# Patient Record
Sex: Female | Born: 1945 | Race: White | Hispanic: No | Smoking: Never smoker
Health system: Southern US, Community
[De-identification: ages and names within clinical notes are randomized; demographics above are authoritative.]

## PROBLEM LIST (undated history)

## (undated) DIAGNOSIS — IMO0001 Reserved for inherently not codable concepts without codable children: Secondary | ICD-10-CM

## (undated) DIAGNOSIS — E119 Type 2 diabetes mellitus without complications: Secondary | ICD-10-CM

## (undated) DIAGNOSIS — Z9989 Dependence on other enabling machines and devices: Secondary | ICD-10-CM

## (undated) DIAGNOSIS — I10 Essential (primary) hypertension: Secondary | ICD-10-CM

## (undated) DIAGNOSIS — K219 Gastro-esophageal reflux disease without esophagitis: Secondary | ICD-10-CM

## (undated) DIAGNOSIS — F329 Major depressive disorder, single episode, unspecified: Secondary | ICD-10-CM

## (undated) DIAGNOSIS — N189 Chronic kidney disease, unspecified: Secondary | ICD-10-CM

## (undated) DIAGNOSIS — R519 Headache, unspecified: Secondary | ICD-10-CM

## (undated) DIAGNOSIS — Z95810 Presence of automatic (implantable) cardiac defibrillator: Secondary | ICD-10-CM

## (undated) DIAGNOSIS — I209 Angina pectoris, unspecified: Secondary | ICD-10-CM

## (undated) DIAGNOSIS — G4733 Obstructive sleep apnea (adult) (pediatric): Secondary | ICD-10-CM

## (undated) DIAGNOSIS — D860 Sarcoidosis of lung: Secondary | ICD-10-CM

## (undated) DIAGNOSIS — I219 Acute myocardial infarction, unspecified: Secondary | ICD-10-CM

## (undated) DIAGNOSIS — F419 Anxiety disorder, unspecified: Secondary | ICD-10-CM

## (undated) DIAGNOSIS — I442 Atrioventricular block, complete: Secondary | ICD-10-CM

## (undated) DIAGNOSIS — I499 Cardiac arrhythmia, unspecified: Secondary | ICD-10-CM

## (undated) DIAGNOSIS — R51 Headache: Secondary | ICD-10-CM

## (undated) DIAGNOSIS — Z789 Other specified health status: Secondary | ICD-10-CM

## (undated) DIAGNOSIS — F32A Depression, unspecified: Secondary | ICD-10-CM

## (undated) HISTORY — DX: Other specified health status: Z78.9

## (undated) HISTORY — DX: Reserved for inherently not codable concepts without codable children: IMO0001

## (undated) HISTORY — PX: CHOLECYSTECTOMY: SHX55

---

## 2004-08-20 ENCOUNTER — Ambulatory Visit: Payer: Self-pay | Admitting: Occupational Therapy

## 2009-07-11 ENCOUNTER — Observation Stay: Payer: Self-pay | Admitting: Internal Medicine

## 2010-10-12 ENCOUNTER — Ambulatory Visit: Payer: Self-pay | Admitting: Family Medicine

## 2010-10-31 IMAGING — CT CT HEAD WITHOUT CONTRAST
2 series · 16 of 30 positions shown, 20 images · non-contrast
Comparison: none

REASON FOR EXAM: AMS earlier, now resolved
COMMENTS:

[Series 2: without · axial · non-contrast · 0.41mm/px · z∈[-93,+27]mm · 13 of 28 slices shown, 17 images]
[im 2/28  brain]
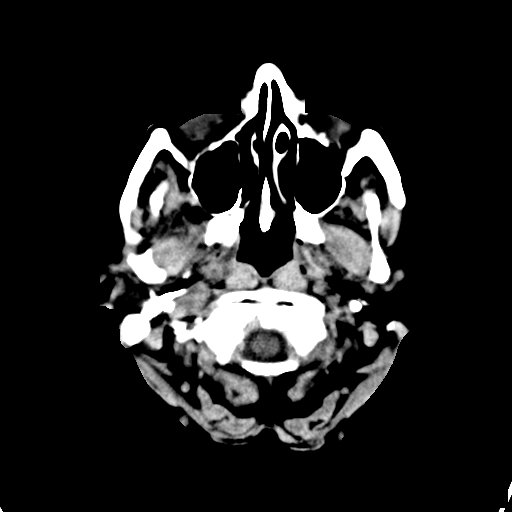
[im 2/28  bone]
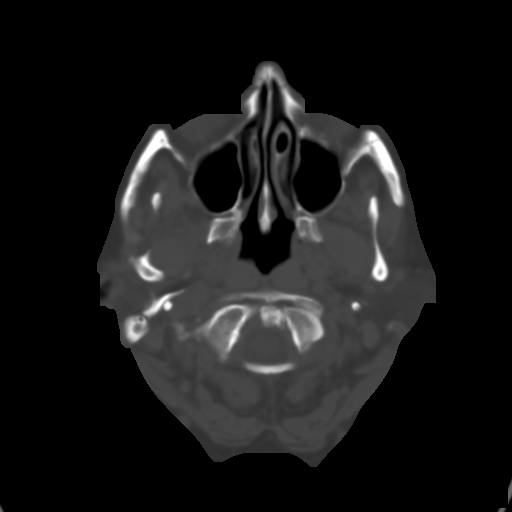
[im 4/28  brain]
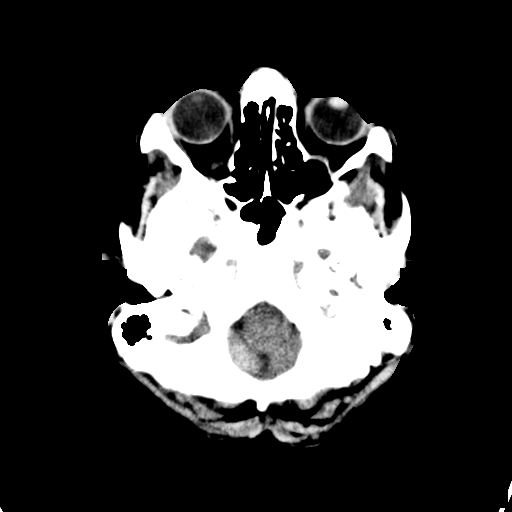
[im 6/28  brain]
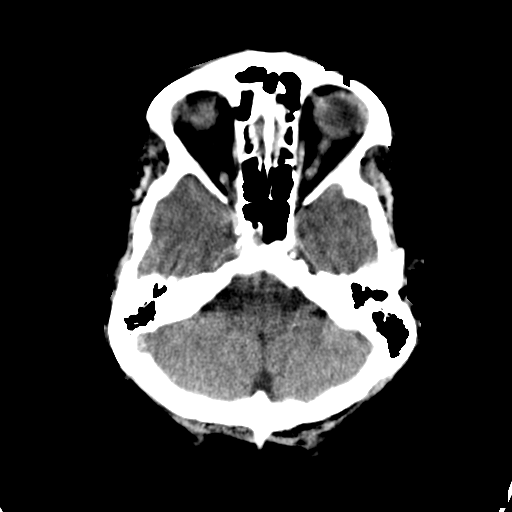
[im 8/28  brain]
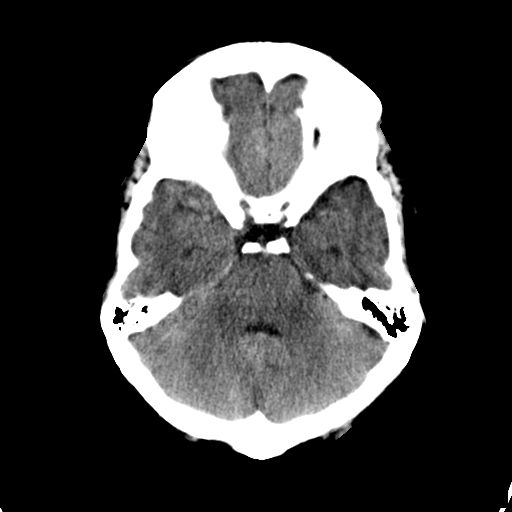
[im 10/28  brain]
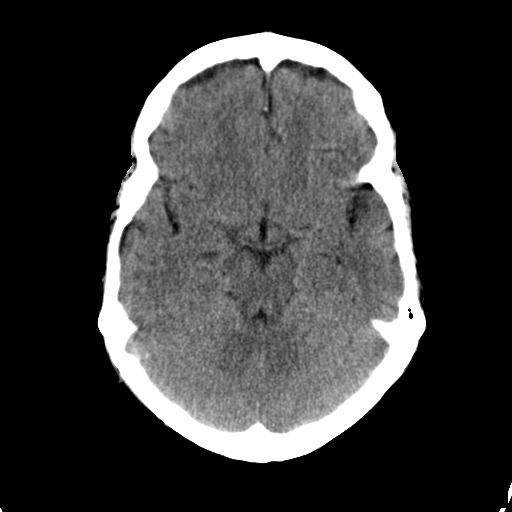
[im 10/28  bone]
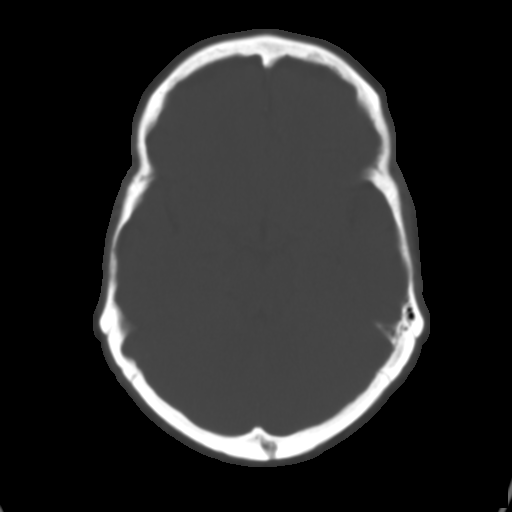
[im 12/28  brain]
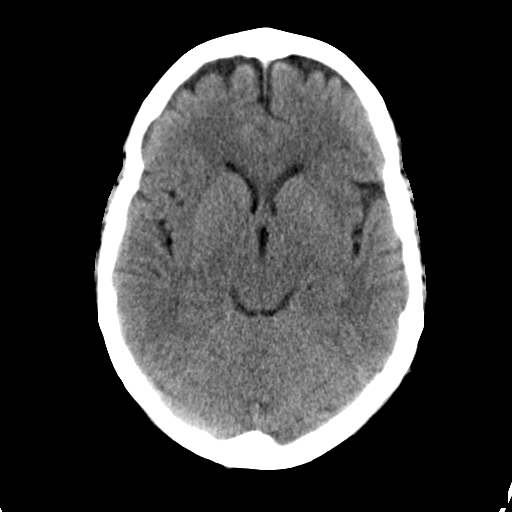
[im 14/28  brain]
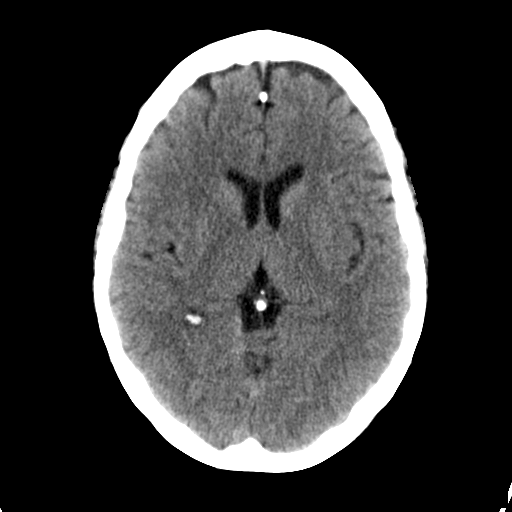
[im 16/28  brain]
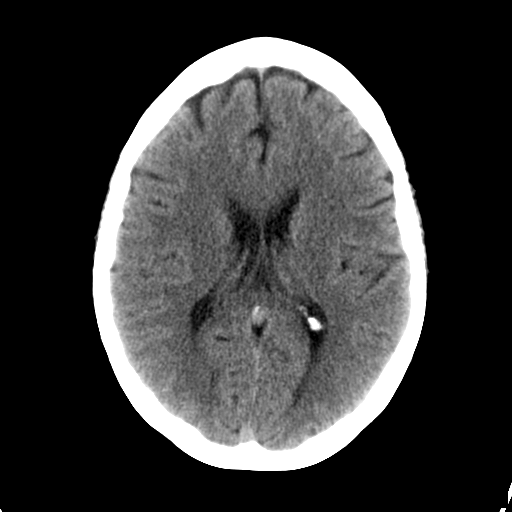
[im 18/28  brain]
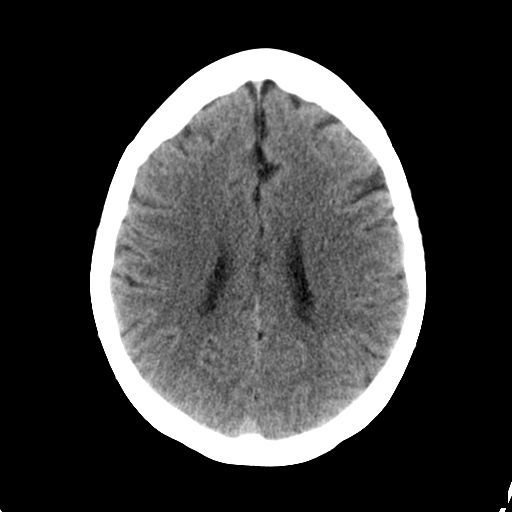
[im 18/28  bone]
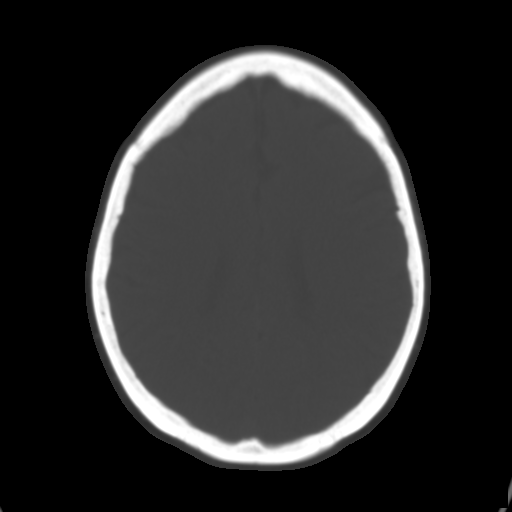
[im 20/28  brain]
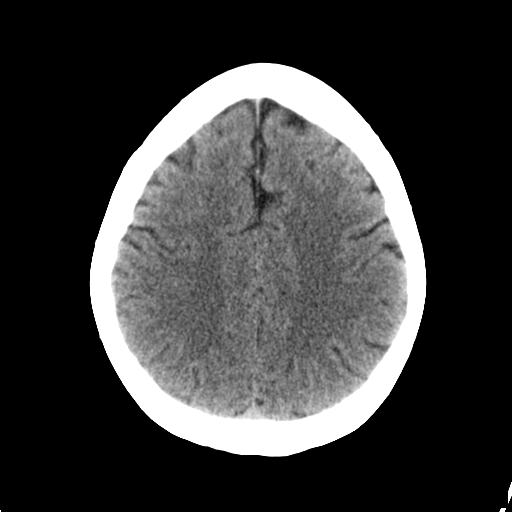
[im 22/28  brain]
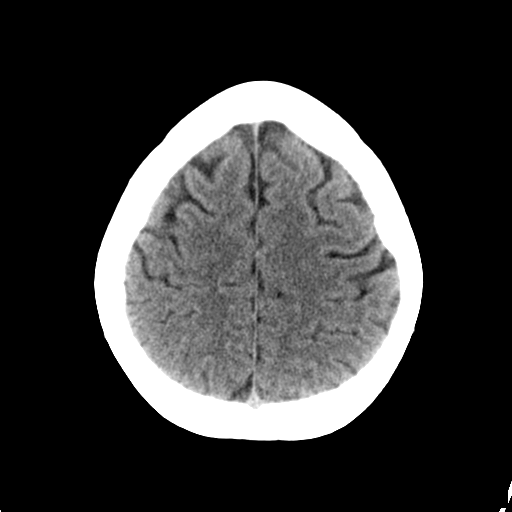
[im 24/28  brain]
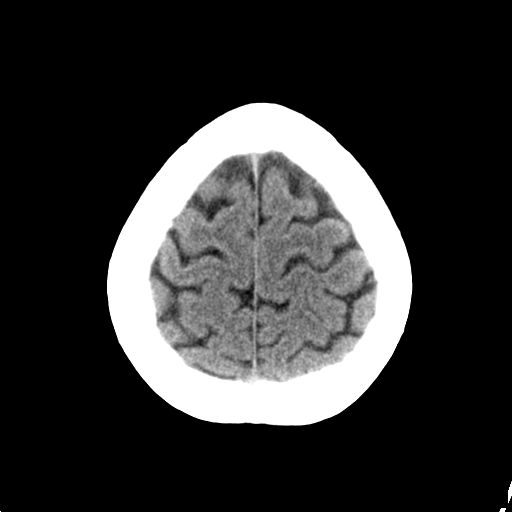
[im 26/28  brain]
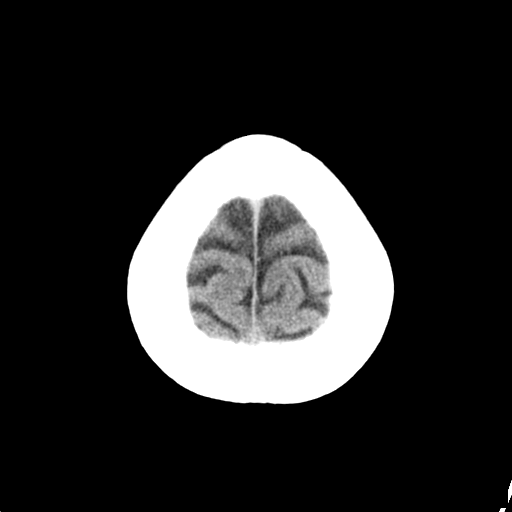
[im 26/28  bone]
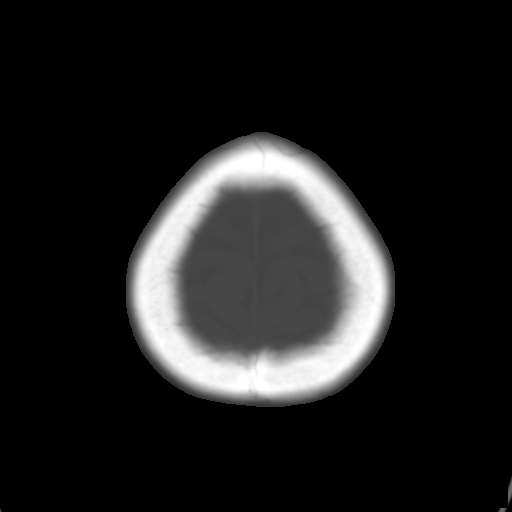

[Series 3: bone · axial · 0.41mm/px · z∈[-93,-53]mm · 3 of 28 slices shown]
[im 2/28  bone]
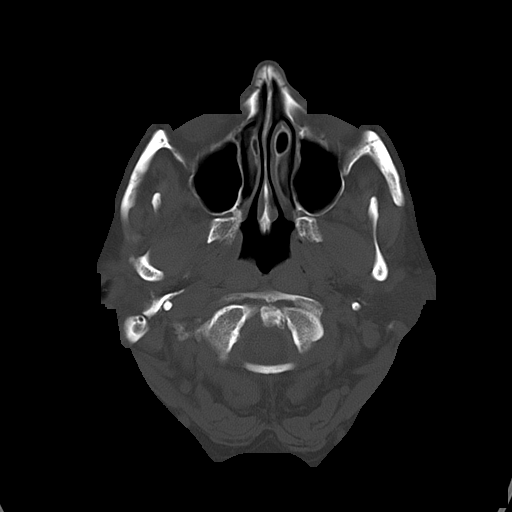
[im 6/28  bone]
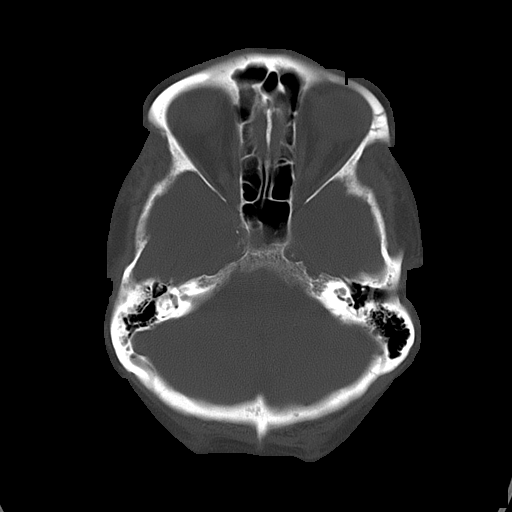
[im 10/28  bone]
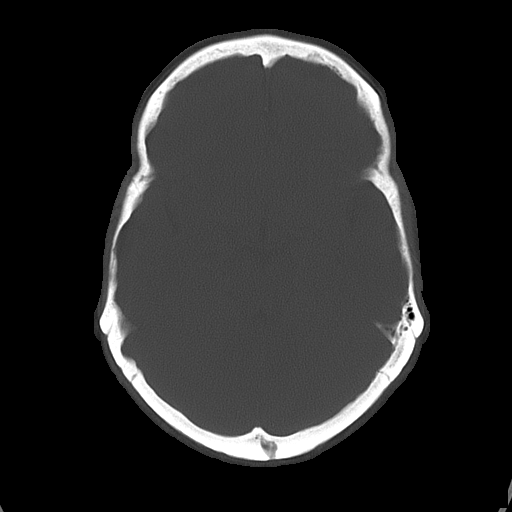

[16 of 30 positions shown; findings below may reference images not displayed]

PROCEDURE:     CT  - CT HEAD WITHOUT CONTRAST  - July 11, 2009  [DATE]

RESULT:     Noncontrast emergent CT of the brain is performed in the
standard fashion. The patient has no previous examination for comparison.

The ventricles and sulci are normal. There is no hemorrhage. There is no
focal mass, mass-effect or midline shift. There is no evidence of edema or
territorial infarct. The bone windows demonstrate normal aeration of the
paranasal sinuses and mastoid air cells. There is no skull fracture
demonstrated.
IMPRESSION: 1. No acute intracranial abnormality.

## 2010-10-31 IMAGING — CR DG CHEST 1V PORT
1 series · 1 of 1 positions shown · non-contrast
Comparison: none

REASON FOR EXAM: AMS
COMMENTS:

PROCEDURE:     DXR - DXR PORTABLE CHEST SINGLE VIEW  - July 11, 2009  [DATE]
RESULT:     There is no previous exam for comparison. The lungs appear
clear. The heart and pulmonary vessels are normal. The bony structures are
unremarkable. Cardiac monitoring electrodes are present.

[view not recorded]
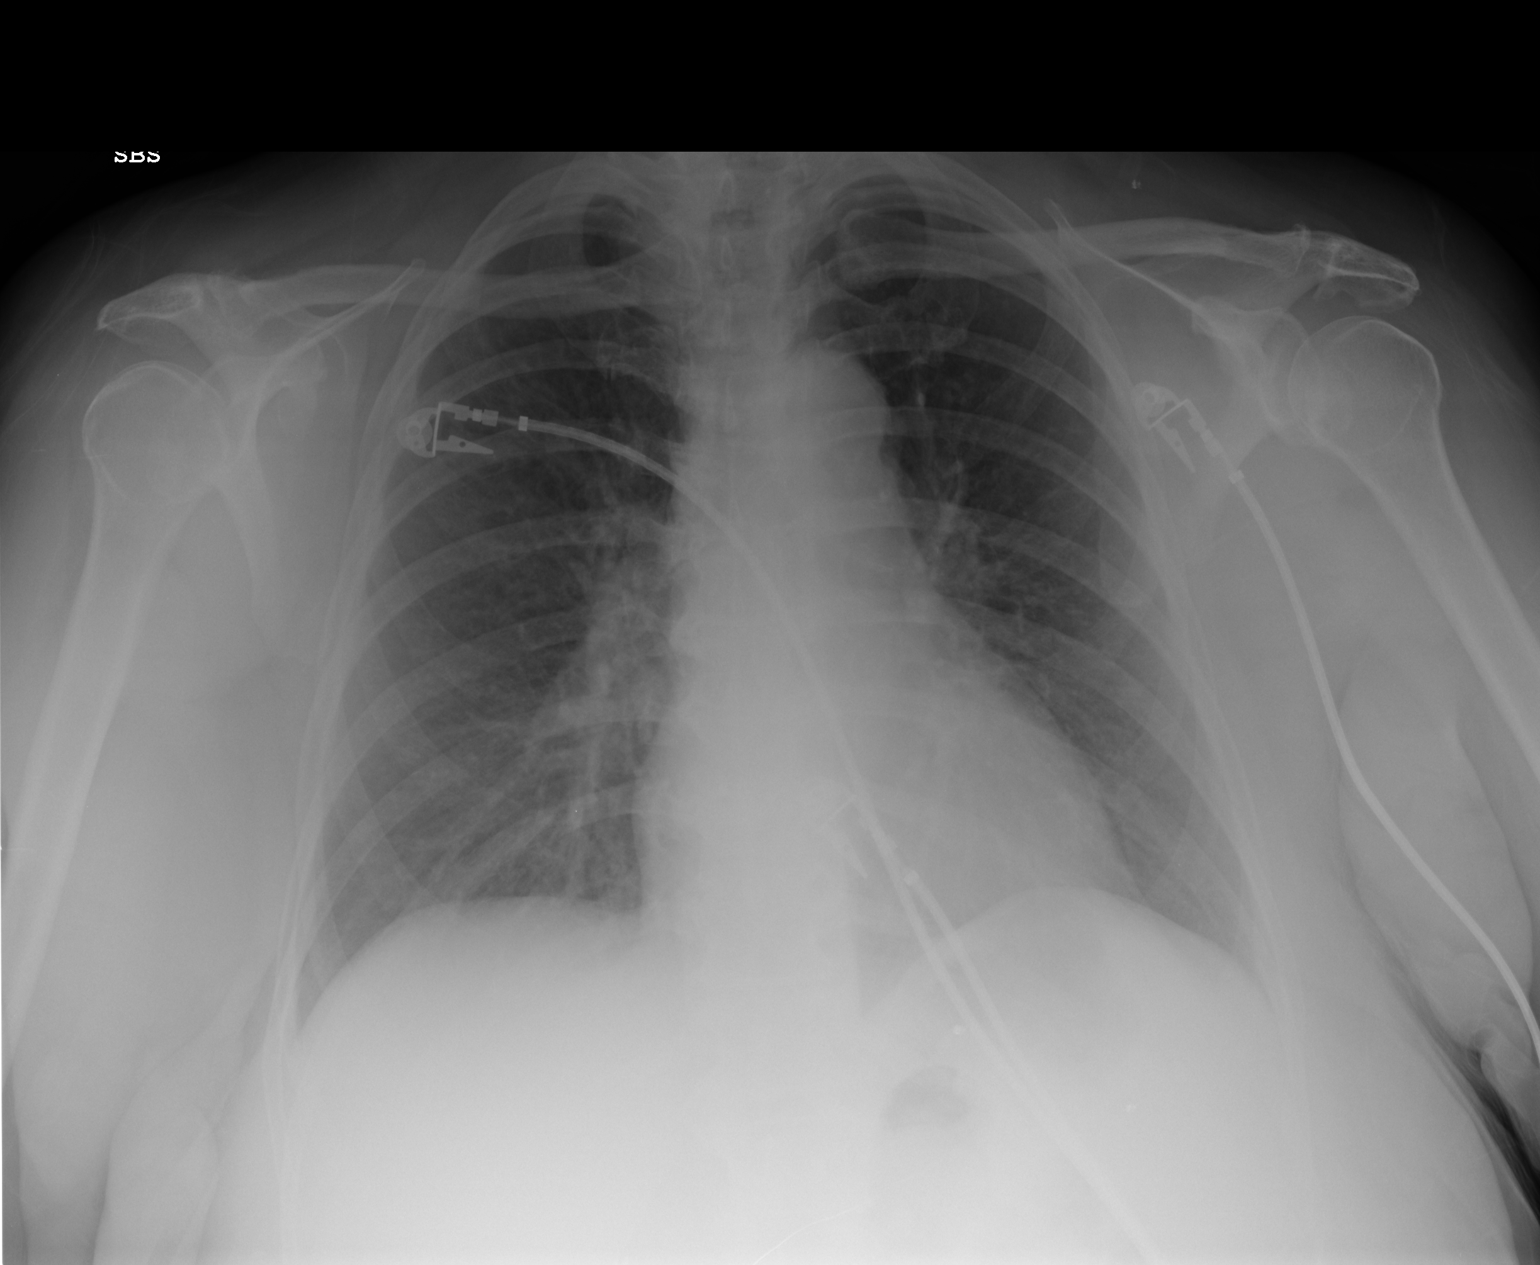

[1 of 1 positions shown; findings below may reference images not displayed]

IMPRESSION: No acute cardiopulmonary disease.

## 2014-07-24 ENCOUNTER — Inpatient Hospital Stay (HOSPITAL_COMMUNITY): Payer: Medicare Other

## 2014-07-24 ENCOUNTER — Inpatient Hospital Stay (HOSPITAL_COMMUNITY): Payer: Medicare Other | Admitting: Anesthesiology

## 2014-07-24 ENCOUNTER — Inpatient Hospital Stay (HOSPITAL_COMMUNITY)
Admission: AD | Admit: 2014-07-24 | Discharge: 2014-07-29 | DRG: 308 | Disposition: A | Discharge status: Home / Self Care | Payer: Medicare Other | Source: Other Acute Inpatient Hospital | Attending: Internal Medicine | Admitting: Internal Medicine

## 2014-07-24 DIAGNOSIS — R001 Bradycardia, unspecified: Secondary | ICD-10-CM | POA: Diagnosis present

## 2014-07-24 DIAGNOSIS — K219 Gastro-esophageal reflux disease without esophagitis: Secondary | ICD-10-CM | POA: Diagnosis present

## 2014-07-24 DIAGNOSIS — E119 Type 2 diabetes mellitus without complications: Secondary | ICD-10-CM | POA: Diagnosis present

## 2014-07-24 DIAGNOSIS — E875 Hyperkalemia: Secondary | ICD-10-CM | POA: Diagnosis present

## 2014-07-24 DIAGNOSIS — D860 Sarcoidosis of lung: Secondary | ICD-10-CM | POA: Diagnosis present

## 2014-07-24 DIAGNOSIS — I82612 Acute embolism and thrombosis of superficial veins of left upper extremity: Secondary | ICD-10-CM | POA: Diagnosis not present

## 2014-07-24 DIAGNOSIS — E872 Acidosis: Secondary | ICD-10-CM | POA: Diagnosis present

## 2014-07-24 DIAGNOSIS — I442 Atrioventricular block, complete: Principal | ICD-10-CM | POA: Diagnosis present

## 2014-07-24 DIAGNOSIS — I38 Endocarditis, valve unspecified: Secondary | ICD-10-CM | POA: Diagnosis present

## 2014-07-24 DIAGNOSIS — Z7982 Long term (current) use of aspirin: Secondary | ICD-10-CM

## 2014-07-24 DIAGNOSIS — J9690 Respiratory failure, unspecified, unspecified whether with hypoxia or hypercapnia: Secondary | ICD-10-CM | POA: Insufficient documentation

## 2014-07-24 DIAGNOSIS — E876 Hypokalemia: Secondary | ICD-10-CM | POA: Diagnosis present

## 2014-07-24 DIAGNOSIS — I2510 Atherosclerotic heart disease of native coronary artery without angina pectoris: Secondary | ICD-10-CM | POA: Diagnosis present

## 2014-07-24 DIAGNOSIS — I129 Hypertensive chronic kidney disease with stage 1 through stage 4 chronic kidney disease, or unspecified chronic kidney disease: Secondary | ICD-10-CM | POA: Diagnosis present

## 2014-07-24 DIAGNOSIS — M1990 Unspecified osteoarthritis, unspecified site: Secondary | ICD-10-CM | POA: Diagnosis present

## 2014-07-24 DIAGNOSIS — Z6841 Body Mass Index (BMI) 40.0 and over, adult: Secondary | ICD-10-CM

## 2014-07-24 DIAGNOSIS — G9341 Metabolic encephalopathy: Secondary | ICD-10-CM | POA: Diagnosis present

## 2014-07-24 DIAGNOSIS — J9601 Acute respiratory failure with hypoxia: Secondary | ICD-10-CM | POA: Diagnosis present

## 2014-07-24 DIAGNOSIS — F329 Major depressive disorder, single episode, unspecified: Secondary | ICD-10-CM | POA: Diagnosis present

## 2014-07-24 DIAGNOSIS — D649 Anemia, unspecified: Secondary | ICD-10-CM | POA: Diagnosis present

## 2014-07-24 DIAGNOSIS — I469 Cardiac arrest, cause unspecified: Secondary | ICD-10-CM | POA: Diagnosis present

## 2014-07-24 DIAGNOSIS — Z452 Encounter for adjustment and management of vascular access device: Secondary | ICD-10-CM

## 2014-07-24 DIAGNOSIS — N189 Chronic kidney disease, unspecified: Secondary | ICD-10-CM | POA: Diagnosis present

## 2014-07-24 DIAGNOSIS — R570 Cardiogenic shock: Secondary | ICD-10-CM | POA: Diagnosis present

## 2014-07-24 DIAGNOSIS — E2740 Unspecified adrenocortical insufficiency: Secondary | ICD-10-CM | POA: Diagnosis present

## 2014-07-24 DIAGNOSIS — Z01818 Encounter for other preprocedural examination: Secondary | ICD-10-CM

## 2014-07-24 DIAGNOSIS — I959 Hypotension, unspecified: Secondary | ICD-10-CM | POA: Diagnosis present

## 2014-07-24 DIAGNOSIS — D696 Thrombocytopenia, unspecified: Secondary | ICD-10-CM | POA: Diagnosis present

## 2014-07-24 DIAGNOSIS — J449 Chronic obstructive pulmonary disease, unspecified: Secondary | ICD-10-CM | POA: Diagnosis present

## 2014-07-24 DIAGNOSIS — J9600 Acute respiratory failure, unspecified whether with hypoxia or hypercapnia: Secondary | ICD-10-CM

## 2014-07-24 DIAGNOSIS — I462 Cardiac arrest due to underlying cardiac condition: Secondary | ICD-10-CM

## 2014-07-24 DIAGNOSIS — E785 Hyperlipidemia, unspecified: Secondary | ICD-10-CM | POA: Diagnosis present

## 2014-07-24 DIAGNOSIS — F419 Anxiety disorder, unspecified: Secondary | ICD-10-CM | POA: Diagnosis present

## 2014-07-24 DIAGNOSIS — Z7952 Long term (current) use of systemic steroids: Secondary | ICD-10-CM | POA: Diagnosis not present

## 2014-07-24 DIAGNOSIS — N179 Acute kidney failure, unspecified: Secondary | ICD-10-CM | POA: Diagnosis present

## 2014-07-24 DIAGNOSIS — R1110 Vomiting, unspecified: Secondary | ICD-10-CM

## 2014-07-24 HISTORY — DX: Anxiety disorder, unspecified: F41.9

## 2014-07-24 HISTORY — DX: Type 2 diabetes mellitus without complications: E11.9

## 2014-07-24 HISTORY — DX: Essential (primary) hypertension: I10

## 2014-07-24 HISTORY — DX: Reserved for inherently not codable concepts without codable children: IMO0001

## 2014-07-24 HISTORY — DX: Headache, unspecified: R51.9

## 2014-07-24 HISTORY — DX: Angina pectoris, unspecified: I20.9

## 2014-07-24 HISTORY — DX: Major depressive disorder, single episode, unspecified: F32.9

## 2014-07-24 HISTORY — DX: Depression, unspecified: F32.A

## 2014-07-24 HISTORY — DX: Headache: R51

## 2014-07-24 HISTORY — DX: Acute myocardial infarction, unspecified: I21.9

## 2014-07-24 HISTORY — DX: Chronic kidney disease, unspecified: N18.9

## 2014-07-24 HISTORY — DX: Gastro-esophageal reflux disease without esophagitis: K21.9

## 2014-07-24 HISTORY — DX: Cardiac arrhythmia, unspecified: I49.9

## 2014-07-24 LAB — POCT I-STAT, CHEM 8
BUN: 109 mg/dL — ABNORMAL HIGH (ref 6–23)
CALCIUM ION: 1.27 mmol/L (ref 1.13–1.30)
Chloride: 109 mEq/L (ref 96–112)
Creatinine, Ser: 5.5 mg/dL — ABNORMAL HIGH (ref 0.50–1.10)
GLUCOSE: 267 mg/dL — AB (ref 70–99)
HEMATOCRIT: 32 % — AB (ref 36.0–46.0)
HEMOGLOBIN: 10.9 g/dL — AB (ref 12.0–15.0)
Potassium: 6.7 mmol/L (ref 3.5–5.1)
Sodium: 134 mmol/L — ABNORMAL LOW (ref 135–145)
TCO2: 14 mmol/L (ref 0–100)

## 2014-07-24 LAB — POCT I-STAT 3, ART BLOOD GAS (G3+)
Acid-base deficit: 14 mmol/L — ABNORMAL HIGH (ref 0.0–2.0)
Bicarbonate: 13.9 mEq/L — ABNORMAL LOW (ref 20.0–24.0)
O2 SAT: 100 %
PO2 ART: 405 mmHg — AB (ref 80.0–100.0)
Patient temperature: 98.2
TCO2: 15 mmol/L (ref 0–100)
pCO2 arterial: 40.6 mmHg (ref 35.0–45.0)
pH, Arterial: 7.142 — CL (ref 7.350–7.450)

## 2014-07-24 LAB — CBC
HEMATOCRIT: 32.7 % — AB (ref 36.0–46.0)
HEMOGLOBIN: 10.5 g/dL — AB (ref 12.0–15.0)
MCH: 28.5 pg (ref 26.0–34.0)
MCHC: 32.1 g/dL (ref 30.0–36.0)
MCV: 88.9 fL (ref 78.0–100.0)
Platelets: 187 10*3/uL (ref 150–400)
RBC: 3.68 MIL/uL — ABNORMAL LOW (ref 3.87–5.11)
RDW: 17.4 % — ABNORMAL HIGH (ref 11.5–15.5)
WBC: 12.9 10*3/uL — ABNORMAL HIGH (ref 4.0–10.5)

## 2014-07-24 LAB — LACTIC ACID, PLASMA: Lactic Acid, Venous: 2.3 mmol/L — ABNORMAL HIGH (ref 0.5–2.2)

## 2014-07-24 LAB — MRSA PCR SCREENING: MRSA by PCR: NEGATIVE

## 2014-07-24 MED ORDER — SODIUM CHLORIDE 0.9 % IV SOLN
1.0000 g | Freq: Once | INTRAVENOUS | Status: AC
  Administered 2014-07-24: 1 g via INTRAVENOUS
  Filled 2014-07-24 (×2): qty 10

## 2014-07-24 MED ORDER — ASPIRIN 300 MG RE SUPP
300.0000 mg | RECTAL | Status: AC
  Administered 2014-07-25: 300 mg via RECTAL
  Filled 2014-07-24 (×2): qty 1

## 2014-07-24 MED ORDER — LIDOCAINE HCL (CARDIAC) 20 MG/ML IV SOLN
INTRAVENOUS | Status: AC
  Filled 2014-07-24: qty 5

## 2014-07-24 MED ORDER — SODIUM CHLORIDE 0.9 % IV SOLN
INTRAVENOUS | Status: DC
  Administered 2014-07-24: via INTRAVENOUS

## 2014-07-24 MED ORDER — ETOMIDATE 2 MG/ML IV SOLN
INTRAVENOUS | Status: AC | PRN
  Administered 2014-07-24: 6 mg via INTRAVENOUS

## 2014-07-24 MED ORDER — ETOMIDATE 2 MG/ML IV SOLN
INTRAVENOUS | Status: AC
  Administered 2014-07-24: 6 mg
  Filled 2014-07-24: qty 20

## 2014-07-24 MED ORDER — SUCCINYLCHOLINE CHLORIDE 20 MG/ML IJ SOLN
INTRAMUSCULAR | Status: AC
  Administered 2014-07-24: 100 mg
  Filled 2014-07-24: qty 1

## 2014-07-24 MED ORDER — FENTANYL BOLUS VIA INFUSION
25.0000 ug | INTRAVENOUS | Status: DC | PRN
  Filled 2014-07-24: qty 25

## 2014-07-24 MED ORDER — CETYLPYRIDINIUM CHLORIDE 0.05 % MT LIQD
7.0000 mL | Freq: Four times a day (QID) | OROMUCOSAL | Status: DC
  Administered 2014-07-25 – 2014-07-27 (×10): 7 mL via OROMUCOSAL

## 2014-07-24 MED ORDER — FENTANYL CITRATE 0.05 MG/ML IJ SOLN
INTRAMUSCULAR | Status: AC
  Filled 2014-07-24: qty 2

## 2014-07-24 MED ORDER — ALBUTEROL SULFATE (2.5 MG/3ML) 0.083% IN NEBU
2.5000 mg | INHALATION_SOLUTION | RESPIRATORY_TRACT | Status: DC | PRN
Start: 2014-07-24 — End: 2014-07-29

## 2014-07-24 MED ORDER — SODIUM BICARBONATE 8.4 % IV SOLN
INTRAVENOUS | Status: DC
  Administered 2014-07-25: 02:00:00 via INTRAVENOUS
  Filled 2014-07-24 (×4): qty 150

## 2014-07-24 MED ORDER — INSULIN ASPART 100 UNIT/ML ~~LOC~~ SOLN
10.0000 [IU] | Freq: Once | SUBCUTANEOUS | Status: AC
  Administered 2014-07-24: 10 [IU] via INTRAVENOUS

## 2014-07-24 MED ORDER — ASPIRIN 81 MG PO CHEW: 324.0000 mg | CHEWABLE_TABLET | ORAL | Status: AC

## 2014-07-24 MED ORDER — BUDESONIDE 0.5 MG/2ML IN SUSP
0.5000 mg | Freq: Two times a day (BID) | RESPIRATORY_TRACT | Status: DC
  Administered 2014-07-25 – 2014-07-29 (×9): 0.5 mg via RESPIRATORY_TRACT
  Filled 2014-07-24 (×2): qty 2
  Filled 2014-07-24: qty 4
  Filled 2014-07-24 (×7): qty 2
  Filled 2014-07-24: qty 4
  Filled 2014-07-24: qty 2

## 2014-07-24 MED ORDER — DOPAMINE-DEXTROSE 3.2-5 MG/ML-% IV SOLN
INTRAVENOUS | Status: AC
  Filled 2014-07-24: qty 250

## 2014-07-24 MED ORDER — HEPARIN SODIUM (PORCINE) 5000 UNIT/ML IJ SOLN
5000.0000 [IU] | Freq: Three times a day (TID) | INTRAMUSCULAR | Status: DC
  Administered 2014-07-25 – 2014-07-28 (×10): 5000 [IU] via SUBCUTANEOUS
  Filled 2014-07-24 (×14): qty 1

## 2014-07-24 MED ORDER — SUCCINYLCHOLINE CHLORIDE 20 MG/ML IJ SOLN
INTRAMUSCULAR | Status: AC | PRN
  Administered 2014-07-24: 100 mg via INTRAVENOUS

## 2014-07-24 MED ORDER — DEXTROSE 50 % IV SOLN
INTRAVENOUS | Status: AC
Start: 2014-07-24 — End: 2014-07-25
  Filled 2014-07-24: qty 50

## 2014-07-24 MED ORDER — FENTANYL CITRATE 0.05 MG/ML IJ SOLN
100.0000 ug | Freq: Once | INTRAMUSCULAR | Status: AC
  Administered 2014-07-24: 100 ug via INTRAVENOUS

## 2014-07-24 MED ORDER — HYDROCORTISONE NA SUCCINATE PF 100 MG IJ SOLR
50.0000 mg | Freq: Four times a day (QID) | INTRAMUSCULAR | Status: DC
  Administered 2014-07-25 – 2014-07-27 (×10): 50 mg via INTRAVENOUS
  Filled 2014-07-24 (×15): qty 1

## 2014-07-24 MED ORDER — CHLORHEXIDINE GLUCONATE 0.12 % MT SOLN
15.0000 mL | Freq: Two times a day (BID) | OROMUCOSAL | Status: DC
  Administered 2014-07-25 – 2014-07-27 (×6): 15 mL via OROMUCOSAL
  Filled 2014-07-24 (×6): qty 15

## 2014-07-24 MED ORDER — ROCURONIUM BROMIDE 50 MG/5ML IV SOLN
INTRAVENOUS | Status: AC
  Filled 2014-07-24: qty 2

## 2014-07-24 MED ORDER — DEXTROSE 50 % IV SOLN
50.0000 mL | Freq: Once | INTRAVENOUS | Status: AC
  Administered 2014-07-24: 50 mL via INTRAVENOUS
  Filled 2014-07-24: qty 50

## 2014-07-24 MED ORDER — FENTANYL CITRATE 0.05 MG/ML IJ SOLN
50.0000 ug | Freq: Once | INTRAMUSCULAR | Status: AC
  Administered 2014-07-24: 50 ug via INTRAVENOUS
  Filled 2014-07-24: qty 2

## 2014-07-24 MED ORDER — DOPAMINE-DEXTROSE 3.2-5 MG/ML-% IV SOLN
0.0000 ug/kg/min | INTRAVENOUS | Status: DC
  Administered 2014-07-24: 10 ug/kg/min via INTRAVENOUS

## 2014-07-24 MED ORDER — MIDAZOLAM HCL 2 MG/2ML IJ SOLN
INTRAMUSCULAR | Status: AC
  Administered 2014-07-24: 2 mg via INTRAVENOUS
  Filled 2014-07-24: qty 2

## 2014-07-24 MED ORDER — MIDAZOLAM HCL 2 MG/2ML IJ SOLN
2.0000 mg | INTRAMUSCULAR | Status: DC | PRN
  Administered 2014-07-24 – 2014-07-26 (×6): 2 mg via INTRAVENOUS
  Filled 2014-07-24 (×5): qty 2

## 2014-07-24 MED ORDER — DEXTROSE 50 % IV SOLN
50.0000 mL | Freq: Once | INTRAVENOUS | Status: AC
  Administered 2014-07-24: 25 mL via INTRAVENOUS
  Filled 2014-07-24: qty 50

## 2014-07-24 MED ORDER — SODIUM CHLORIDE 0.9 % IV SOLN
25.0000 ug/h | INTRAVENOUS | Status: DC
  Administered 2014-07-24 – 2014-07-25 (×2): 25 ug/h via INTRAVENOUS
  Administered 2014-07-25: 200 ug/h via INTRAVENOUS
  Filled 2014-07-24 (×3): qty 50

## 2014-07-24 MED ORDER — CETYLPYRIDINIUM CHLORIDE 0.05 % MT LIQD: 7.0000 mL | Freq: Four times a day (QID) | OROMUCOSAL | Status: DC

## 2014-07-24 MED ORDER — INSULIN ASPART 100 UNIT/ML ~~LOC~~ SOLN
0.0000 [IU] | SUBCUTANEOUS | Status: DC
  Administered 2014-07-25: 1 [IU] via SUBCUTANEOUS
  Administered 2014-07-25 (×2): 3 [IU] via SUBCUTANEOUS
  Administered 2014-07-25: 2 [IU] via SUBCUTANEOUS
  Administered 2014-07-25: 5 [IU] via SUBCUTANEOUS
  Administered 2014-07-25: 1 [IU] via SUBCUTANEOUS
  Administered 2014-07-26: 3 [IU] via SUBCUTANEOUS

## 2014-07-24 MED ORDER — ARFORMOTEROL TARTRATE 15 MCG/2ML IN NEBU
15.0000 ug | INHALATION_SOLUTION | Freq: Two times a day (BID) | RESPIRATORY_TRACT | Status: DC
  Administered 2014-07-25 – 2014-07-29 (×8): 15 ug via RESPIRATORY_TRACT
  Filled 2014-07-24 (×13): qty 2

## 2014-07-24 MED ORDER — PANTOPRAZOLE SODIUM 40 MG IV SOLR
40.0000 mg | Freq: Every day | INTRAVENOUS | Status: DC
  Filled 2014-07-24: qty 40

## 2014-07-24 MED ORDER — IPRATROPIUM-ALBUTEROL 0.5-2.5 (3) MG/3ML IN SOLN
3.0000 mL | Freq: Four times a day (QID) | RESPIRATORY_TRACT | Status: DC
  Administered 2014-07-25 – 2014-07-27 (×9): 3 mL via RESPIRATORY_TRACT
  Filled 2014-07-24 (×10): qty 3

## 2014-07-24 MED ORDER — SODIUM CHLORIDE 0.9 % IV SOLN
250.0000 mL | INTRAVENOUS | Status: DC | PRN
Start: 2014-07-24 — End: 2014-07-25

## 2014-07-24 NOTE — Procedures (Signed)
Arterial Catheter Insertion Procedure Note Leah French [id number] [birth year]  Procedure: Insertion of Arterial Catheter  Indications: Blood pressure monitoring and Frequent blood sampling  Procedure Details Consent: Unable to obtain consent because of emergent medical necessity. Time Out: Verified patient identification, verified procedure, site/side was marked, verified correct patient position, special equipment/implants available, medications/allergies/relevent history reviewed, required imaging and test results available.  Performed  Maximum sterile technique was used including antiseptics, cap, gloves, gown, hand hygiene, mask and sheet. Skin prep: Chlorhexidine; local anesthetic administered 20 gauge catheter was inserted into right radial artery using the Seldinger technique.  Evaluation Blood flow good; BP tracing good. Complications: No apparent complications.   Gaetano HawthorneBrooker, Leah French 07/24/2014

## 2014-07-24 NOTE — Consult Note (Signed)
CARDIOLOGY CONSULT NOTE  Patient ID: Leah French, MRN: [id number], DOB/AGE: 02/06/1946 68 y.o. Admit date: 07/24/2014 Date of Consult: 07/24/2014  Primary Physician: No primary care provider on file. Primary Cardiologist: Unknown at this time  Chief Complaint: AMS Reason for Consultation: bradycardia; CHB, currently on external pacer  HPI: 68 y.o. 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 History is from outside records and nursing as patient is nonresponsive. At OSH, was found to be bradycardic to 20s and was started on transcutaneous pacing after no benefit from atropine and isoprotenenol gtt. Found to be in renal failure (Cr 5) and hyperkalemic (7). Treated with calcium, insulin and d50. [facility]. Reportedly was stable and doing well on external pacing.   Family reports that she was having significant nausea and vomiting and then had decreased responsiveness and weakness prompting the ER visitn.  No known cardiac disease previously. No MI. No known cardiac sarcoid.  During stabilization in the CCU, lost pacing capture (leads moved/artifact during synchronous pacing which lead to ~30 secs of ventricular asystole, p waves clearly seen at a rate of 70) and patient had brief code before restoration of external pacing. Occurred again during intubation (artifact on leads caused pacing inhibit) but now on asynch mode, rate of 60 and 80 mA.   Labs pending here.   WBC 12 hct 35 plts 233 INR 1.1  Troponin i 0.02 ast 19 ALT 16   PMH 1. CKD, baseline unknown. 2. DM2 3. HTN 4. Hyperlipidemia 5. Sarcoidosis, on steroids, dxd ~2 yrs 6. COPD 7. Arthritis  Meds: ASA Fish oil advair Prednisone Omeprazole Simvastatin methaocarbomal Lasix Lisinopril dulera inhaler kcl 20 bid toprol 50   Inpatient Medications:   Pending  Allergies: Allergies not on file  History   Social History  . Marital Status: Widowed    Spouse Name: N/A    Number of Children: N/A  . Years of Education: N/A   Occupational History  . Not on file.   Social History Main Topics  . Smoking status: Not on file  . Smokeless tobacco: Not on file  . Alcohol Use: Not on file  . Drug Use: Not on file  . Sexual Activity: Not on file   Other Topics Concern  . Not on file   Social History Narrative  . No narrative on file     No family history on file.  Unable to complete due to emergent presentation  Review of Systems: Unable to complete due to   Labs: See HPI  EKG: 100% externally paced,   Physical Exam: Blood pressure 182/140, pulse 70, temperature 98.2 F (36.8 C), temperature source Axillary, resp. rate 23, weight 98.3 kg (216 lb 11.4 oz), SpO2 100 %. General: obese, responsive to sternal rub Head: Normocephalic, atraumatic, sclera non-icteric, no xanthomas, nares are without discharge.  Neck: minimal neck Lungs: clear anteriorly Heart: RRR with S1 S2. No murmurs. 100% paced. Abdomen: Soft,no obvious abdominal masses. Msk: unable to test.  Extremities: No clubbing or cyanosis. No edema. Cool. Difficult pulses. Neuro: responsive to sternal rub. Moves all extremities spontaneously. Psych: unable to test.   Problem List 1. Ventricular asystole, complete heart block,  2. Acute on chronic renal failure, likely due to vomiting, diuretics, ACEI 3. Altered mental status 4. Respiratory distress 5. Hyperkalemia 6. Hypotensive 7. Hyperlipidemia 8. Pulmonary sarcoidosis.   Assessment and Plan:  68  y.o. 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  In terms of her complete heart  block/ventricular asystole, reversal of her electrolyte abnormalities is key. With administration of calcium, an underlying ventricular escape rhythm at 60 developed which is encouraging that reversal of electrolyte abnormalities will correct asystole. In discussion with renal and CCM, plan is for trial of dialysis and if no improvement, anticipate placing temp wire via femoral.  Precipitant of nausea, vomiting and electrolyte abnormalities is unclear currently (which came first?) but certainly excerebated by her medications (ACEI, lasix, KCL) and beta blocker. This meds should be held.   History of pulmonary sarcoidoisis is interesting and raises the possibility of cardiac involvement and conduction abnormalities per family, no history of cardiac involvement. The need for permanent pacing and the need for investigation of cardiac involvement of sarcoid is will be assessed. Echo when stabilized.   Doubt primary ischemic event as precipitant. Reasonable to continue aspirin, track troponins, continue statin if able.  Summary of recs:  -external pacing trial during dialysis, if no improvement once lytes corrected, anticipate temp wire placement. Need for permanent pacing tbd.  -hold offending meds- ACEI, lasix, KCL, BB  -echo when stablized  -continue aspirin, statin if possible  Thank you for letting us participate in this patient's care. Will follow. Please call with questions.  >50 minutes time spent.  Signed, Adolm JosephWHITLOCK, Troy SineMATTHEW C. MD 07/24/2014, 10:19 PM

## 2014-07-24 NOTE — H&P (Addendum)
PULMONARY / CRITICAL CARE MEDICINE   Name: Leah BolsterShirley M French MRN: [id number] DOB: 1946/06/27    ADMISSION DATE:  07/24/2014 CONSULTATION DATE:  07/24/2014  REFERRING MD :  [facility]  CHIEF COMPLAINT:  Complete heart block, acute renal failure, hyperkalemia  INITIAL PRESENTATION:  68 y.o. F, [facility] with generalized weakness.  In ED, found to have complete heart block with HR in 20's, acute renal failure, hyperkalemia.  She was paced transcutaneously and transferred to [facility] for further evaluation.     STUDIES:  CXR 12/24 >>> right basilar opacification  SIGNIFICANT EVENTS: 12/24 - [facility], [facility] with 3rd degree heart block, acute renal failure, hyperkalemia.   HISTORY OF PRESENT ILLNESS:  Pt is encephalopathic; therefore, this HPI is obtained from chart review. Leah BolsterShirley M Paynter is a 68 y.o. F with PMH of CAD, HTN, MI, HLD, valvular heart disease, COPD, Sarcoid, GERD, Arthritis, Depression, Anxiety, DM.  [facility] with generalized weakness.  In ED, she was noted to be in complete heart block with HR in low 20's.  In addition, she had acute renal failure (SCr 5.50), and Hyperkalemia (7.1).  She was given atropine without much change; therefore, was started on transcutaneous pacing and started on isoproterenol.  For her potassium, she received albuterol, calcium, insulin, D50. [facility] for further evaluation and management.  While in our ICU, pt had brief (<10 second loss of pulses).  ACLS protocol initiated and ROSC achieved within seconds.  Anesthesia to bedside preparing for intubation.  PAST MEDICAL HISTORY :   has no past medical history on file.  has no past surgical history on file. Prior to Admission medications   Not on File   Allergies not on file  FAMILY HISTORY:  No family history on file.  SOCIAL HISTORY:  has no tobacco, alcohol, and drug history on file.  REVIEW OF  SYSTEMS:  Unable to obtain as pt is encephalopathic.  SUBJECTIVE:   VITAL SIGNS: Temp:  [98.2 F (36.8 C)] 98.2 F (36.8 C) (12/24 2145) Pulse Rate:  [70] 70 (12/24 2145) Resp:  [23] 23 (12/24 2145) BP: (182)/(140) 182/140 mmHg (12/24 2145) SpO2:  [100 %] 100 % (12/24 2145) Weight:  [98.3 kg (216 lb 11.4 oz)] 98.3 kg (216 lb 11.4 oz) (12/24 2145) HEMODYNAMICS:   VENTILATOR SETTINGS:   INTAKE / OUTPUT: Intake/Output    None     PHYSICAL EXAMINATION: General: Obese female, in NAD. Neuro: Opens eyes to verbal stimuli, does not answer questions, follows basic commands intermittently.  HEENT: [place]/AT. PERRL, sclerae anicteric. Cardiovascular: RRR, no M/R/G.  Currently being transcutaneously paced at 70 bpm. Lungs: Respirations shallow, breath sounds distant.  No W/R/R appreciated.  Mild paradoxical breathing. Abdomen: BS hypoactive, soft, NT/ND.  Musculoskeletal: No gross deformities, 1+ edema. Skin: Intact, warm, no rashes.  LABS:  CBC No results for input(s): WBC, HGB, HCT, PLT in the last 168 hours. Coag's No results for input(s): APTT, INR in the last 168 hours. BMET No results for input(s): NA, K, CL, CO2, BUN, CREATININE, GLUCOSE in the last 168 hours. Electrolytes No results for input(s): CALCIUM, MG, PHOS in the last 168 hours. Sepsis Markers No results for input(s): LATICACIDVEN, PROCALCITON, O2SATVEN in the last 168 hours. ABG No results for input(s): PHART, PCO2ART, PO2ART in the last 168 hours. Liver Enzymes No results for input(s): AST, ALT, ALKPHOS, BILITOT, ALBUMIN in the last 168 hours. Cardiac Enzymes  No results for input(s): TROPONINI, PROBNP in the last 168 hours. Glucose No results for input(s): GLUCAP in the last 168 hours.  Imaging No results found.   ASSESSMENT / PLAN:  PULMONARY ETT 12/24 >>> A: Acute hypoxic respiratory failure Hx COPD, Sarcoid P:   Full vent support, wean as able. VAP bundle. Daily SBT if / when meets  criteria. DuoNebs / Albuterol PRN. Budesonide / Brovana. Hold outpatient Advair, Dulera, Prednisone. CXR now and in AM.  CARDIOVASCULAR L IJ CVL 12/24 >>> A line 12/24 >>> A:  Brief asystolic arrest - resolved Complete heart block - currently being transcutaneously paced Labile BP's Hx sarcoid - not known whether any cardiac involvement Hx CAD, HTN, HLD, MI, valvular heart disease P:  Cards consulted. Continue transcutaneous pacing for now. May need transvenous pacing, will defer to cards. Dopamine started and titrate for MAP of 65 mmHg. Discontinue isoproterenol. Will place arterial line for more accurate BP assessment. Check troponin / lactate. Start stress steroids chronically on prednisone. Hold outpatient simvastatin, lasix, lisinopril, metoprolol.  RENAL A:   Acute renal failure Hyperkalemia - received albuterol, calcium, insulin, D50 while at [facility]. AG metabolic acidosis P:   Nephrology consulted. D5W with 3 amps of bicarb at 150 ml/hr. Check lactate. CMP. BMP in AM. Replace electrolytes as indicated.  GASTROINTESTINAL A:   GERD Nutrition P:   SUP: Pantoprazole. NPO.  HEMATOLOGIC A:   Mild anemia VTE Prophylaxis P:  Transfuse per usual ICU guidelines. SCD's / Heparin. CBC now and in AM.  INFECTIOUS A:   No indication for infection P:   Monitor clinically.  ENDOCRINE A:   DM P:   CBG's q4hr. SSI.  NEUROLOGIC A:   Acute metabolic encephalopathy P:   Sedation: Fentanyl gtt. RASS goal: 0 to -1. Daily WUA.  Family updated: Family updated at length multiple times.  Interdisciplinary Family Meeting v Palliative Care Meeting:  Due by: 12/31.  Rutherford Guysahul Desai, [place] - [facility] Pgr: [phone]  or [phone] 07/24/2014, 10:02 PM  Above note edited in full.  Patient made several attempts to wake up but given need for external pacer will sedate via drips.  Cardiology contacted will place a  temp pacer in.  Nephrology contacted will place HD catheter for dialysis.  ?CVVH vs HD.  Family updated by me.  Will start bicarb drip.  Vent mode adjusted.  The patient is critically ill with multiple organ systems failure and requires high complexity decision making for assessment and support, frequent evaluation and titration of therapies, application of advanced monitoring technologies and extensive interpretation of multiple databases.   Critical Care Time devoted to patient care services described in this note is 120 minutes. This time reflects time of care of this signee Dr Koren BoundWesam Yacoub. This critical care time does not reflect procedure time, or teaching time or supervisory time of PA/NP/Med student/Med Resident etc but could involve care discussion time.  Alyson ReedyWesam G. Yacoub, M.D. [facility]. Pager: [phone]. After hours pager: [phone].

## 2014-07-25 ENCOUNTER — Inpatient Hospital Stay (HOSPITAL_COMMUNITY): Payer: Medicare Other

## 2014-07-25 DIAGNOSIS — N179 Acute kidney failure, unspecified: Secondary | ICD-10-CM | POA: Insufficient documentation

## 2014-07-25 DIAGNOSIS — J9690 Respiratory failure, unspecified, unspecified whether with hypoxia or hypercapnia: Secondary | ICD-10-CM | POA: Insufficient documentation

## 2014-07-25 DIAGNOSIS — J9601 Acute respiratory failure with hypoxia: Secondary | ICD-10-CM

## 2014-07-25 LAB — COMPREHENSIVE METABOLIC PANEL
ALT: 171 U/L — ABNORMAL HIGH (ref 0–35)
ANION GAP: 10 (ref 5–15)
AST: 158 U/L — ABNORMAL HIGH (ref 0–37)
Albumin: 2.9 g/dL — ABNORMAL LOW (ref 3.5–5.2)
Alkaline Phosphatase: 40 U/L (ref 39–117)
BUN: 85 mg/dL — AB (ref 6–23)
CALCIUM: 8.5 mg/dL (ref 8.4–10.5)
CO2: 15 mmol/L — ABNORMAL LOW (ref 19–32)
Chloride: 111 mEq/L (ref 96–112)
Creatinine, Ser: 5.43 mg/dL — ABNORMAL HIGH (ref 0.50–1.10)
GFR calc non Af Amer: 7 mL/min — ABNORMAL LOW (ref >90)
GFR, EST AFRICAN AMERICAN: 8 mL/min — AB (ref >90)
GLUCOSE: 274 mg/dL — AB (ref 70–99)
Potassium: 6.6 mmol/L (ref 3.5–5.1)
Sodium: 136 mmol/L (ref 135–145)
TOTAL PROTEIN: 5 g/dL — AB (ref 6.0–8.3)
Total Bilirubin: 0.5 mg/dL (ref 0.3–1.2)

## 2014-07-25 LAB — BASIC METABOLIC PANEL
Anion gap: 10 (ref 5–15)
BUN: 85 mg/dL — ABNORMAL HIGH (ref 6–23)
CO2: 15 mmol/L — AB (ref 19–32)
CREATININE: 5.27 mg/dL — AB (ref 0.50–1.10)
Calcium: 9.2 mg/dL (ref 8.4–10.5)
Chloride: 110 mEq/L (ref 96–112)
GFR calc Af Amer: 9 mL/min — ABNORMAL LOW (ref >90)
GFR calc non Af Amer: 8 mL/min — ABNORMAL LOW (ref >90)
GLUCOSE: 150 mg/dL — AB (ref 70–99)
Potassium: 6.6 mmol/L (ref 3.5–5.1)
Sodium: 135 mmol/L (ref 135–145)

## 2014-07-25 LAB — MAGNESIUM
Magnesium: 1.9 mg/dL (ref 1.5–2.5)
Magnesium: 1.9 mg/dL (ref 1.5–2.5)

## 2014-07-25 LAB — GLUCOSE, CAPILLARY
Glucose-Capillary: 133 mg/dL — ABNORMAL HIGH (ref 70–99)
Glucose-Capillary: 138 mg/dL — ABNORMAL HIGH (ref 70–99)
Glucose-Capillary: 223 mg/dL — ABNORMAL HIGH (ref 70–99)
Glucose-Capillary: 143 mg/dL — ABNORMAL HIGH (ref 70–99)
Glucose-Capillary: 172 mg/dL — ABNORMAL HIGH (ref 70–99)
Glucose-Capillary: 208 mg/dL — ABNORMAL HIGH (ref 70–99)
Glucose-Capillary: 262 mg/dL — ABNORMAL HIGH (ref 70–99)

## 2014-07-25 LAB — BASIC METABOLIC PANEL WITH GFR
Anion gap: 11 (ref 5–15)
BUN: 17 mg/dL (ref 6–23)
CO2: 26 mmol/L (ref 19–32)
Calcium: 8.1 mg/dL — ABNORMAL LOW (ref 8.4–10.5)
Chloride: 101 meq/L (ref 96–112)
Creatinine, Ser: 1.74 mg/dL — ABNORMAL HIGH (ref 0.50–1.10)
GFR calc Af Amer: 34 mL/min — ABNORMAL LOW
GFR calc non Af Amer: 29 mL/min — ABNORMAL LOW
Glucose, Bld: 179 mg/dL — ABNORMAL HIGH (ref 70–99)
Potassium: 3.7 mmol/L (ref 3.5–5.1)
Sodium: 138 mmol/L (ref 135–145)

## 2014-07-25 LAB — PHOSPHORUS
PHOSPHORUS: 6.6 mg/dL — AB (ref 2.3–4.6)
Phosphorus: 5.4 mg/dL — ABNORMAL HIGH (ref 2.3–4.6)

## 2014-07-25 LAB — CBC
HCT: 34.9 % — ABNORMAL LOW (ref 36.0–46.0)
HEMOGLOBIN: 11.3 g/dL — AB (ref 12.0–15.0)
MCH: 27.8 pg (ref 26.0–34.0)
MCHC: 32.4 g/dL (ref 30.0–36.0)
MCV: 86 fL (ref 78.0–100.0)
PLATELETS: 208 10*3/uL (ref 150–400)
RBC: 4.06 MIL/uL (ref 3.87–5.11)
RDW: 17.1 % — ABNORMAL HIGH (ref 11.5–15.5)
WBC: 9.9 10*3/uL (ref 4.0–10.5)

## 2014-07-25 LAB — URINALYSIS, ROUTINE W REFLEX MICROSCOPIC
Bilirubin Urine: NEGATIVE
Glucose, UA: NEGATIVE mg/dL
Ketones, ur: NEGATIVE mg/dL
Leukocytes, UA: NEGATIVE
Nitrite: NEGATIVE
Protein, ur: 30 mg/dL — AB
Specific Gravity, Urine: 1.011 (ref 1.005–1.030)
Urobilinogen, UA: 0.2 mg/dL (ref 0.0–1.0)
pH: 5 (ref 5.0–8.0)

## 2014-07-25 LAB — URINE MICROSCOPIC-ADD ON

## 2014-07-25 LAB — TROPONIN I: TROPONIN I: 0.13 ng/mL — AB (ref <0.031)

## 2014-07-25 LAB — TSH: TSH: 6.492 u[IU]/mL — ABNORMAL HIGH (ref 0.350–4.500)

## 2014-07-25 LAB — HEMOGLOBIN A1C
Hgb A1c MFr Bld: 5.7 % — ABNORMAL HIGH
Mean Plasma Glucose: 117 mg/dL — ABNORMAL HIGH

## 2014-07-25 LAB — HEPATITIS B SURFACE ANTIGEN: HEP B S AG: NEGATIVE

## 2014-07-25 LAB — HEPATITIS B CORE ANTIBODY, TOTAL: Hep B Core Total Ab: NONREACTIVE

## 2014-07-25 LAB — BRAIN NATRIURETIC PEPTIDE: B Natriuretic Peptide: 126.6 pg/mL — ABNORMAL HIGH (ref 0.0–100.0)

## 2014-07-25 LAB — HEPATITIS B SURFACE ANTIBODY,QUALITATIVE: Hep B S Ab: NEGATIVE

## 2014-07-25 MED ORDER — SODIUM CHLORIDE 0.9 % IJ SOLN
10.0000 mL | INTRAMUSCULAR | Status: DC | PRN
  Administered 2014-07-26: 10 mL
  Filled 2014-07-25: qty 40

## 2014-07-25 MED ORDER — HEPARIN SODIUM (PORCINE) 1000 UNIT/ML DIALYSIS: 1000.0000 [IU] | INTRAMUSCULAR | Status: DC | PRN

## 2014-07-25 MED ORDER — SODIUM CHLORIDE 0.9 % IV SOLN
1.0000 g | Freq: Once | INTRAVENOUS | Status: AC
  Administered 2014-07-25: 1 g via INTRAVENOUS
  Filled 2014-07-25: qty 10

## 2014-07-25 MED ORDER — SODIUM CHLORIDE 0.9 % IJ SOLN
10.0000 mL | Freq: Two times a day (BID) | INTRAMUSCULAR | Status: DC
  Administered 2014-07-25 – 2014-07-28 (×7): 10 mL

## 2014-07-25 MED ORDER — SODIUM CHLORIDE 0.9 % IV SOLN: 100.0000 mL | INTRAVENOUS | Status: DC | PRN

## 2014-07-25 MED ORDER — LIDOCAINE HCL (PF) 1 % IJ SOLN: 5.0000 mL | INTRAMUSCULAR | Status: DC | PRN

## 2014-07-25 MED ORDER — VITAL HIGH PROTEIN PO LIQD
1000.0000 mL | ORAL | Status: DC
  Administered 2014-07-25: 1000 mL
  Filled 2014-07-25 (×3): qty 1000

## 2014-07-25 MED ORDER — ALTEPLASE 2 MG IJ SOLR
2.0000 mg | Freq: Once | INTRAMUSCULAR | Status: AC | PRN
  Filled 2014-07-25: qty 2

## 2014-07-25 MED ORDER — NOREPINEPHRINE BITARTRATE 1 MG/ML IV SOLN
2.0000 ug/min | INTRAVENOUS | Status: DC
  Administered 2014-07-25: 5 ug/min via INTRAVENOUS
  Filled 2014-07-25: qty 4

## 2014-07-25 MED ORDER — PENTAFLUOROPROP-TETRAFLUOROETH EX AERO: 1.0000 "application " | INHALATION_SPRAY | CUTANEOUS | Status: DC | PRN

## 2014-07-25 MED ORDER — NOREPINEPHRINE BITARTRATE 1 MG/ML IV SOLN
2.0000 ug/min | INTRAVENOUS | Status: DC
  Administered 2014-07-25: 8 ug/min via INTRAVENOUS
  Filled 2014-07-25: qty 16

## 2014-07-25 MED ORDER — NEPRO/CARBSTEADY PO LIQD
237.0000 mL | ORAL | Status: DC | PRN
  Filled 2014-07-25: qty 237

## 2014-07-25 MED ORDER — PANTOPRAZOLE SODIUM 40 MG PO PACK
40.0000 mg | PACK | ORAL | Status: DC
  Administered 2014-07-25: 40 mg
  Filled 2014-07-25 (×2): qty 20

## 2014-07-25 MED ORDER — LIDOCAINE-PRILOCAINE 2.5-2.5 % EX CREA
1.0000 "application " | TOPICAL_CREAM | CUTANEOUS | Status: DC | PRN
  Filled 2014-07-25: qty 5

## 2014-07-25 MED ORDER — SODIUM CHLORIDE 0.9 % IV BOLUS (SEPSIS)
1000.0000 mL | Freq: Once | INTRAVENOUS | Status: AC
  Administered 2014-07-25: 1000 mL via INTRAVENOUS

## 2014-07-25 MED ORDER — SODIUM CHLORIDE 0.9 % IV SOLN
INTRAVENOUS | Status: DC | PRN
  Administered 2014-07-24: 22:00:00 via INTRAVENOUS

## 2014-07-25 MED ORDER — DOPAMINE-DEXTROSE 3.2-5 MG/ML-% IV SOLN: 0.0000 ug/kg/min | INTRAVENOUS | Status: DC

## 2014-07-25 MED ORDER — HEPARIN SODIUM (PORCINE) 1000 UNIT/ML DIALYSIS
20.0000 [IU]/kg | INTRAMUSCULAR | Status: DC | PRN
  Administered 2014-07-25: 2000 [IU] via INTRAVENOUS_CENTRAL

## 2014-07-25 MED ORDER — SODIUM CHLORIDE 0.9 % IV SOLN
250.0000 mL | INTRAVENOUS | Status: DC
  Administered 2014-07-25: 250 mL via INTRAVENOUS

## 2014-07-25 NOTE — Procedures (Signed)
Emergent dialysis initiated for treatment of hyperkalemia which is symptomatic.  Line was placed by CCM. Pt being paced transcutaneously. Leah French

## 2014-07-25 NOTE — Progress Notes (Signed)
Assessment: 1 Hyperkalemia (exogenous, ACE-I, decreased excretion) improved with dialysis 2 Renal Failure (?acute and chronic) ? DM, HBP, Sarcoid 3 Complete heart block due to #1, resolved Plan: Supportive therapy, will follow for further dialysis needs or CKD management; HD cath in place  Subjective: Interval History: High potassium and CHB corrected  Objective: Vital signs in last 24 hours: Temp:  [98.2 F (36.8 C)-98.9 F (37.2 C)] 98.9 F (37.2 C) (12/25 0819) Pulse Rate:  [50-119] 102 (12/25 0819) Resp:  [13-27] 27 (12/25 0819) BP: (92-182)/(46-140) 123/47 mmHg (12/25 0819) SpO2:  [96 %-100 %] 100 % (12/25 0819) Arterial Line BP: (76-141)/(48-73) 137/61 mmHg (12/25 0819) FiO2 (%):  [40 %] 40 % (12/25 0819) Weight:  [91.8 kg (202 lb 6.1 oz)-98.3 kg (216 lb 11.4 oz)] 91.8 kg (202 lb 6.1 oz) (12/25 0400) Weight change:   Intake/Output from previous day: 12/24 0701 - 12/25 0700 In: 1082.1 [I.V.:1082.1] Out: 390 [Urine:390] Intake/Output this shift: Total I/O In: 13.1 [I.V.:13.1] Out: 325 [Urine:325]  General appearance: intubated and sedated Chest wall: no tenderness Cardio: regular rate and rhythm, S1, S2 normal, no murmur, click, rub or gallop Extremities: edema 1+  Lab Results:  Recent Labs  07/24/14 2257 07/25/14 0045  WBC 12.9* 9.9  HGB 10.5*  10.9* 11.3*  HCT 32.7*  32.0* 34.9*  PLT 187 208   BMET:  Recent Labs  07/25/14 0045 07/25/14 0440  NA 135 138  K 6.6* 3.7  CL 110 101  CO2 15* 26  GLUCOSE 150* 179*  BUN 85* 17  CREATININE 5.27* 1.74*  CALCIUM 9.2 8.1*   No results for input(s): PTH in the last 72 hours. Iron Studies: No results for input(s): IRON, TIBC, TRANSFERRIN, FERRITIN in the last 72 hours. Studies/Results: [place] Renal Port  07/25/2014   Lauris PoagAlvin C Rannie Craney, MD     07/25/2014 12:57 AM Emergent dialysis initiated for treatment of hyperkalemia which  is symptomatic.  Line was placed by CCM. Pt being paced  transcutaneously. Ghassan Coggeshall  C   Dg Chest Port 1 View  07/25/2014   CLINICAL DATA:  Endotracheal tube placement. Acute respiratory failure.  EXAM: PORTABLE CHEST - 1 VIEW  COMPARISON:  07/25/2014 at 12:32 a.m.  FINDINGS: Endotracheal tube tip 3.5 cm above the carina. Right IJ line tip: SVC. Left subclavian line tip: Cavoatrial junction. Nasogastric tube enters the stomach.  No pneumothorax. Borderline cardiomegaly. Perihilar interstitial accentuation mildly improved compared to the earlier exam.  IMPRESSION: 1. Improved perihilar interstitial accentuation compared to the earlier exam. Tubes and lines appear satisfactorily positioned.   Electronically Signed   By: Herbie BaltimoreWalt  Liebkemann M.D.   On: 07/25/2014 09:05   Dg Chest Port 1 View  07/25/2014   CLINICAL DATA:  Tube and line adjustment, central line placement  EXAM: PORTABLE CHEST - 1 VIEW  COMPARISON:  07/24/2014 at 11:49 p.m.  FINDINGS: Endotracheal tube now terminates 1.6 cm above the carina. Nasogastric tube tip terminates below the level of the hemidiaphragms but is not included in the field of view. Left subclavian approach dual lumen catheter tips terminate at the cavoatrial junction. New right IJ approach central line tip terminates over the distal SVC. No pneumothorax. No other change.  IMPRESSION: Tube and line placement as above.   Electronically Signed   By: Christiana PellantGretchen  Green M.D.   On: 07/25/2014 01:33   Dg Chest Port 1 View  07/25/2014   CLINICAL DATA:  Central line placement  EXAM: PORTABLE CHEST - 1 VIEW  COMPARISON:  07/24/2014 at  10:47 p.m.  FINDINGS: Endotracheal tube tip remains at the proximal right mainstem bronchus and could be withdrawn 2 cm for more optimal positioning. Interval placement of left subclavian approach central line with tip at the cavoatrial junction. No pneumothorax. No change otherwise.  IMPRESSION: Malpositioned endotracheal tube with tip at the right mainstem bronchus.  Left subclavian central line with tip over the cavoatrial junction.   Critical Value/emergent results were called by telephone at the time of interpretation on 07/25/2014 at 12:36 am to [facility] Sh[facility] for Dr. Koren BoundWESAM YACOUB , who verbally acknowledged these results.   Electronically Signed   By: Christiana PellantGretchen  Green M.D.   On: 07/25/2014 00:36   Dg Chest Port 1 View  07/24/2014   CLINICAL DATA:  Intubation  EXAM: PORTABLE CHEST - 1 VIEW  COMPARISON:  Same date, 5:32 p.m.  FINDINGS: Endotracheal tube tip is at the proximal right mainstem bronchus. Pacing pad overlies the chest. Cardiac leads obscure detail. Mild cardiomegaly noted with central vascular congestion. Trace pleural effusions. Borderline interstitial edema noted.  IMPRESSION: Endotracheal tube tip at the right mainstem bronchus, consider pulling back 2 cm for more optimal positioning.  Critical Value/emergent results were called by telephone at the time of interpretation on 07/24/2014 at 11:00 pm to RN Youlanda MightyShondra for Dr. Rutherford GuysAHUL DESAI , who verbally acknowledged these results.   Electronically Signed   By: Christiana PellantGretchen  Green M.D.   On: 07/24/2014 23:00   Scheduled: . antiseptic oral rinse  7 mL Mouth Rinse QID  . arformoterol  15 mcg Nebulization BID  . budesonide  0.5 mg Nebulization BID  . chlorhexidine  15 mL Mouth Rinse BID  . feeding supplement (VITAL HIGH PROTEIN)  1,000 mL Per Tube Q24H  . heparin  5,000 Units Subcutaneous 3 times per day  . hydrocortisone sodium succinate  50 mg Intravenous Q6H  . insulin aspart  0-9 Units Subcutaneous 6 times per day  . ipratropium-albuterol  3 mL Nebulization Q6H  . lidocaine (cardiac) 100 mg/775ml      . pantoprazole sodium  40 mg Per Tube Q24H  . rocuronium      . sodium chloride  10-40 mL Intracatheter Q12H     LOS: 1 day   Oneka Parada C 07/25/2014,9:16 AM

## 2014-07-25 NOTE — Procedures (Signed)
Central Venous Catheter Insertion Procedure Note Laretta BolsterShirley M Welling [id number] 1946/07/07  Procedure: Insertion of Central Venous Catheter Indications: Assessment of intravascular volume, Drug and/or fluid administration and Frequent blood sampling  Procedure Details Consent: Risks of procedure as well as the alternatives and risks of each were explained to the (patient/caregiver).  Consent for procedure obtained. Time Out: Verified patient identification, verified procedure, site/side was marked, verified correct patient position, special equipment/implants available, medications/allergies/relevent history reviewed, required imaging and test results available.  Performed  Maximum sterile technique was used including antiseptics, cap, gloves, gown, hand hygiene, mask and sheet. Skin prep: Chlorhexidine; local anesthetic administered A antimicrobial bonded/coated triple lumen catheter was placed in the right internal jugular vein using the Seldinger technique.  Evaluation Blood flow good Complications: No apparent complications Patient did tolerate procedure well. Chest X-ray ordered to verify placement.  CXR: pending.  U/S used in placement.  YACOUB,WESAM 07/25/2014, 12:07 AM

## 2014-07-25 NOTE — Procedures (Signed)
Central Venous Catheter Insertion Procedure Note - Trialysis catheter Leah French [id number] 16-Oct-1945  Procedure: Insertion of Central Venous Catheter Indications: Dialysis access  Procedure Details Consent: Risks of procedure as well as the alternatives and risks of each were explained to the (patient/caregiver).  Consent for procedure obtained. Time Out: Verified patient identification, verified procedure, site/side was marked, verified correct patient position, special equipment/implants available, medications/allergies/relevent history reviewed, required imaging and test results available.  Performed  Maximum sterile technique was used including antiseptics, cap, gloves, gown, hand hygiene, mask and sheet. Skin prep: Chlorhexidine; local anesthetic administered A antimicrobial bonded/coated triple lumen catheter was placed in the left subclavian vein using the Seldinger technique.  Evaluation Blood flow good Complications: No apparent complications Patient did tolerate procedure well. Chest X-ray ordered to verify placement.  CXR: normal.  U/S used in placement.  Leah French 07/25/2014, 12:06 AM

## 2014-07-25 NOTE — Progress Notes (Signed)
Subjective:  Intubated and sedated  Objective:  Temp:  [98.2 F (36.8 C)-98.9 F (37.2 C)] 98.9 F (37.2 C) (12/25 0819) Pulse Rate:  [50-119] 102 (12/25 0819) Resp:  [13-27] 27 (12/25 0819) BP: (92-182)/(46-140) 123/47 mmHg (12/25 0819) SpO2:  [96 %-100 %] 100 % (12/25 0819) Arterial Line BP: (76-141)/(48-73) 137/61 mmHg (12/25 0819) FiO2 (%):  [40 %] 40 % (12/25 0819) Weight:  [202 lb 6.1 oz (91.8 kg)-216 lb 11.4 oz (98.3 kg)] 202 lb 6.1 oz (91.8 kg) (12/25 0400) Weight change:   Intake/Output from previous day: 12/24 0701 - 12/25 0700 In: 1082.1 [I.V.:1082.1] Out: 390 [Urine:390]  Intake/Output from this shift: Total I/O In: 13.1 [I.V.:13.1] Out: 325 [Urine:325]  Physical Exam: General appearance: slowed mentation Neck: no adenopathy, no carotid bruit, no JVD, supple, symmetrical, trachea midline and thyroid not enlarged, symmetric, no tenderness/mass/nodules Lungs: clear to auscultation bilaterally Heart: regular rate and rhythm, S1, S2 normal, no murmur, click, rub or gallop Extremities: extremities normal, atraumatic, no cyanosis or edema  Lab Results: Results for orders placed or performed during the hospital encounter of 07/24/14 (from the past 48 hour(s))  MRSA PCR Screening     Status: None   Collection Time: 07/24/14  9:48 PM  Result Value Ref Range   MRSA by PCR NEGATIVE NEGATIVE    Comment:        The GeneXpert MRSA Assay (FDA approved for NASAL specimens only), is one component of a comprehensive MRSA colonization surveillance program. It is not intended to diagnose MRSA infection nor to guide or monitor treatment for MRSA infections.   CBC     Status: Abnormal   Collection Time: 07/24/14 10:57 PM  Result Value Ref Range   WBC 12.9 (H) 4.0 - 10.5 K/uL   RBC 3.68 (L) 3.87 - 5.11 MIL/uL   Hemoglobin 10.5 (L) 12.0 - 15.0 g/dL   HCT 32.7 (L) 36.0 - 46.0 %   MCV 88.9 78.0 - 100.0 fL   MCH 28.5 26.0 - 34.0 pg   MCHC 32.1 30.0 - 36.0 g/dL   RDW 17.4 (H) 11.5 - 15.5 %   Platelets 187 150 - 400 K/uL  Comprehensive metabolic panel     Status: Abnormal   Collection Time: 07/24/14 10:57 PM  Result Value Ref Range   Sodium 136 135 - 145 mmol/L    Comment: Please note change in reference range.   Potassium 6.6 (HH) 3.5 - 5.1 mmol/L    Comment: Please note change in reference range. REPEATED TO VERIFY CRITICAL RESULT CALLED TO, READ BACK BY AND VERIFIED WITH: HAYES R,RN 07/25/14 0005 WAYK    Chloride 111 96 - 112 mEq/L   CO2 15 (L) 19 - 32 mmol/L   Glucose, Bld 274 (H) 70 - 99 mg/dL   BUN 85 (H) 6 - 23 mg/dL   Creatinine, Ser 5.43 (H) 0.50 - 1.10 mg/dL   Calcium 8.5 8.4 - 10.5 mg/dL   Total Protein 5.0 (L) 6.0 - 8.3 g/dL   Albumin 2.9 (L) 3.5 - 5.2 g/dL   AST 158 (H) 0 - 37 U/L   ALT 171 (H) 0 - 35 U/L   Alkaline Phosphatase 40 39 - 117 U/L   Total Bilirubin 0.5 0.3 - 1.2 mg/dL   GFR calc non Af Amer 7 (L) >90 mL/min   GFR calc Af Amer 8 (L) >90 mL/min    Comment: (NOTE) The eGFR has been calculated using the CKD EPI equation. This calculation has not  been validated in all clinical situations. eGFR's persistently <90 mL/min signify possible Chronic Kidney Disease.    Anion gap 10 5 - 15  Magnesium     Status: None   Collection Time: 07/24/14 10:57 PM  Result Value Ref Range   Magnesium 1.9 1.5 - 2.5 mg/dL  Phosphorus     Status: Abnormal   Collection Time: 07/24/14 10:57 PM  Result Value Ref Range   Phosphorus 6.6 (H) 2.3 - 4.6 mg/dL  Troponin I     Status: Abnormal   Collection Time: 07/24/14 10:57 PM  Result Value Ref Range   Troponin I 0.13 (H) <0.031 ng/mL    Comment:        PERSISTENTLY INCREASED TROPONIN VALUES IN THE RANGE OF 0.04-0.49 ng/mL CAN BE SEEN IN:       -UNSTABLE ANGINA       -CONGESTIVE HEART FAILURE       -MYOCARDITIS       -CHEST TRAUMA       -ARRYHTHMIAS       -LATE PRESENTING MYOCARDIAL INFARCTION       -COPD   CLINICAL FOLLOW-UP RECOMMENDED. Please note change in reference  range.   TSH     Status: Abnormal   Collection Time: 07/24/14 10:57 PM  Result Value Ref Range   TSH 6.492 (H) 0.350 - 4.500 uIU/mL  Brain natriuretic peptide     Status: Abnormal   Collection Time: 07/24/14 10:57 PM  Result Value Ref Range   B Natriuretic Peptide 126.6 (H) 0.0 - 100.0 pg/mL    Comment: Please note change in reference range.  Lactic acid, plasma     Status: Abnormal   Collection Time: 07/24/14 10:57 PM  Result Value Ref Range   Lactic Acid, Venous 2.3 (H) 0.5 - 2.2 mmol/L  I-STAT, chem 8     Status: Abnormal   Collection Time: 07/24/14 10:57 PM  Result Value Ref Range   Sodium 134 (L) 135 - 145 mmol/L   Potassium 6.7 (HH) 3.5 - 5.1 mmol/L   Chloride 109 96 - 112 mEq/L   BUN 109 (H) 6 - 23 mg/dL   Creatinine, Ser 5.50 (H) 0.50 - 1.10 mg/dL   Glucose, Bld 267 (H) 70 - 99 mg/dL   Calcium, Ion 1.27 1.13 - 1.30 mmol/L   TCO2 14 0 - 100 mmol/L   Hemoglobin 10.9 (L) 12.0 - 15.0 g/dL   HCT 32.0 (L) 36.0 - 46.0 %   Comment NOTIFIED PHYSICIAN   I-STAT 3, arterial blood gas (G3+)     Status: Abnormal   Collection Time: 07/24/14 11:30 PM  Result Value Ref Range   pH, Arterial 7.142 (LL) 7.350 - 7.450   pCO2 arterial 40.6 35.0 - 45.0 mmHg   pO2, Arterial 405.0 (H) 80.0 - 100.0 mmHg   Bicarbonate 13.9 (L) 20.0 - 24.0 mEq/L   TCO2 15 0 - 100 mmol/L   O2 Saturation 100.0 %   Acid-base deficit 14.0 (H) 0.0 - 2.0 mmol/L   Patient temperature 98.2 F    Collection site ARTERIAL LINE    Drawn by RT    Sample type ARTERIAL    Comment NOTIFIED PHYSICIAN   CBC     Status: Abnormal   Collection Time: 07/25/14 12:45 AM  Result Value Ref Range   WBC 9.9 4.0 - 10.5 K/uL   RBC 4.06 3.87 - 5.11 MIL/uL   Hemoglobin 11.3 (L) 12.0 - 15.0 g/dL   HCT 34.9 (L) 36.0 - 46.0 %  MCV 86.0 78.0 - 100.0 fL   MCH 27.8 26.0 - 34.0 pg   MCHC 32.4 30.0 - 36.0 g/dL   RDW 17.1 (H) 11.5 - 15.5 %   Platelets 208 150 - 400 K/uL  Basic metabolic panel     Status: Abnormal   Collection Time:  07/25/14 12:45 AM  Result Value Ref Range   Sodium 135 135 - 145 mmol/L    Comment: Please note change in reference range.   Potassium 6.6 (HH) 3.5 - 5.1 mmol/L    Comment: Please note change in reference range. REPEATED TO VERIFY CRITICAL RESULT CALLED TO, READ BACK BY AND VERIFIED WITH: JACKSON S,RN 07/25/14 0201 WAYK    Chloride 110 96 - 112 mEq/L   CO2 15 (L) 19 - 32 mmol/L   Glucose, Bld 150 (H) 70 - 99 mg/dL   BUN 85 (H) 6 - 23 mg/dL   Creatinine, Ser 5.27 (H) 0.50 - 1.10 mg/dL   Calcium 9.2 8.4 - 10.5 mg/dL   GFR calc non Af Amer 8 (L) >90 mL/min   GFR calc Af Amer 9 (L) >90 mL/min    Comment: (NOTE) The eGFR has been calculated using the CKD EPI equation. This calculation has not been validated in all clinical situations. eGFR's persistently <90 mL/min signify possible Chronic Kidney Disease.    Anion gap 10 5 - 15  Magnesium     Status: None   Collection Time: 07/25/14 12:45 AM  Result Value Ref Range   Magnesium 1.9 1.5 - 2.5 mg/dL  Phosphorus     Status: Abnormal   Collection Time: 07/25/14 12:45 AM  Result Value Ref Range   Phosphorus 5.4 (H) 2.3 - 4.6 mg/dL  Glucose, capillary     Status: Abnormal   Collection Time: 07/25/14  1:43 AM  Result Value Ref Range   Glucose-Capillary 133 (H) 70 - 99 mg/dL   Comment 1 Venous Sample   Urinalysis, Routine w reflex microscopic     Status: Abnormal   Collection Time: 07/25/14  3:02 AM  Result Value Ref Range   Color, Urine YELLOW YELLOW   APPearance CLOUDY (A) CLEAR   Specific Gravity, Urine 1.011 1.005 - 1.030   pH 5.0 5.0 - 8.0   Glucose, UA NEGATIVE NEGATIVE mg/dL   Hgb urine dipstick TRACE (A) NEGATIVE   Bilirubin Urine NEGATIVE NEGATIVE   Ketones, ur NEGATIVE NEGATIVE mg/dL   Protein, ur 30 (A) NEGATIVE mg/dL   Urobilinogen, UA 0.2 0.0 - 1.0 mg/dL   Nitrite NEGATIVE NEGATIVE   Leukocytes, UA NEGATIVE NEGATIVE  Urine microscopic-add on     Status: Abnormal   Collection Time: 07/25/14  3:02 AM  Result Value  Ref Range   Squamous Epithelial / LPF RARE RARE   RBC / HPF 0-2 <3 RBC/hpf   Bacteria, UA MANY (A) RARE   Casts HYALINE CASTS (A) NEGATIVE   Urine-Other AMORPHOUS URATES/PHOSPHATES   Glucose, capillary     Status: Abnormal   Collection Time: 07/25/14  4:04 AM  Result Value Ref Range   Glucose-Capillary 138 (H) 70 - 99 mg/dL   Comment 1 Capillary Sample   Basic metabolic panel     Status: Abnormal   Collection Time: 07/25/14  4:40 AM  Result Value Ref Range   Sodium 138 135 - 145 mmol/L    Comment: Please note change in reference range.   Potassium 3.7 3.5 - 5.1 mmol/L    Comment: Please note change in reference range. DELTA CHECK NOTED  Chloride 101 96 - 112 mEq/L   CO2 26 19 - 32 mmol/L   Glucose, Bld 179 (H) 70 - 99 mg/dL   BUN 17 6 - 23 mg/dL    Comment: DELTA CHECK NOTED DIALYSIS    Creatinine, Ser 1.74 (H) 0.50 - 1.10 mg/dL    Comment: DELTA CHECK NOTED DIALYSIS    Calcium 8.1 (L) 8.4 - 10.5 mg/dL   GFR calc non Af Amer 29 (L) >90 mL/min   GFR calc Af Amer 34 (L) >90 mL/min    Comment: (NOTE) The eGFR has been calculated using the CKD EPI equation. This calculation has not been validated in all clinical situations. eGFR's persistently <90 mL/min signify possible Chronic Kidney Disease.    Anion gap 11 5 - 15  Glucose, capillary     Status: Abnormal   Collection Time: 07/25/14  8:19 AM  Result Value Ref Range   Glucose-Capillary 223 (H) 70 - 99 mg/dL   Comment 1 Arterial Sample     Imaging: Imaging results have been reviewed  Tele: ST  Assessment/Plan:   1. Active Problems: 2.   Complete heart block 3.   Respiratory failure 4.   Acute renal failure syndrome 5.   Time Spent Directly with Patient:  20 minutes  Length of Stay:  LOS: 1 day   Pt no longer requiring external pacing. Intubated, sedated and on low dose dopamine drip. S/P HD yesterday for Acute on chronic RI in setting of hyperkalemia and bradycardia. Pt approp Rx with Calcium, Bicarp  and glucose. K back down to Nl and renal fxn improved. 2D pending. H/O Sarcoidosis. ACE-I, diuretic and BB (Home meds ) on hold. At this point I think the rhythm abnormality has resolved. Vent and pressors per PCCM. We will be avail for further questions as needed but will S/O  Leah French J 07/25/2014, 9:03 AM

## 2014-07-25 NOTE — Consult Note (Signed)
Leah French is a 68 y.o. F with PMH of CAD, HTN, MI, HLD, valvular heart disease, COPD, Sarcoid, GERD, Arthritis, Depression, Anxiety, DM. She presented to [facility] ED on 12/24 with generalized weakness. In ED, she was noted to be in complete heart block with HR in low 20's. In addition, she had acute renal failure (SCr 5.50), and Hyperkalemia (7.1). She was given atropine without much change; therefore, was started on transcutaneous pacing and started on isoproterenol. For her potassium, she received albuterol, calcium, insulin, D50.  She was transferred to [facility] ICU for further evaluation and management.  While at [facility] ICU, pt had brief (<10 second loss of pulses). ACLS protocol initiated. Pt intubated.  Repeat chemistries revealed a potassium of 6.7 and dialysis evaluation requested.   We do not have any old data, but apparently family reports a history of kidney disease and potassium supplementaion. The slow heart rate corrected with IV calcium.   No past medical history on file. No past surgical history on file. Social History:  has no tobacco, alcohol, and drug history on file. Allergies: Allergies not on file No family history on file.  Medications:  Prior to Admission:  No prescriptions prior to admission    ROS: not obtainable  Blood pressure 104/46, pulse 59, temperature 98.2 F (36.8 C), temperature source Axillary, resp. rate 15, weight 98.3 kg (216 lb 11.4 oz), SpO2 99 %.  General appearance: obese caucasian female Head: Normocephalic, without obvious abnormality, atraumatic, oral intubation Eyes: negative Ears: normal TM's and external ear canals both ears Throat: oral intubation Resp: clear Chest wall: rocking from external pacer Cardio:externally paced BE:EFEO Extremities: 1+ Skin: Skin color, texture, turgor normal. No rashes or lesions Neurologic: sedated Results for orders placed or performed during the hospital encounter of 07/24/14 (from the past 48 hour(s))   MRSA PCR Screening     Status: None   Collection Time: 07/24/14  9:48 PM  Result Value Ref Range   MRSA by PCR NEGATIVE NEGATIVE    Comment:        The GeneXpert MRSA Assay (FDA approved for NASAL specimens only), is one component of a comprehensive MRSA colonization surveillance program. It is not intended to diagnose MRSA infection nor to guide or monitor treatment for MRSA infections.   CBC     Status: Abnormal   Collection Time: 07/24/14 10:57 PM  Result Value Ref Range   WBC 12.9 (H) 4.0 - 10.5 K/uL   RBC 3.68 (L) 3.87 - 5.11 MIL/uL   Hemoglobin 10.5 (L) 12.0 - 15.0 g/dL   HCT 32.7 (L) 36.0 - 46.0 %   MCV 88.9 78.0 - 100.0 fL   MCH 28.5 26.0 - 34.0 pg   MCHC 32.1 30.0 - 36.0 g/dL   RDW 17.4 (H) 11.5 - 15.5 %   Platelets 187 150 - 400 K/uL  Comprehensive metabolic panel     Status: Abnormal   Collection Time: 07/24/14 10:57 PM  Result Value Ref Range   Sodium 136 135 - 145 mmol/L    Comment: Please note change in reference range.   Potassium 6.6 (HH) 3.5 - 5.1 mmol/L    Comment: Please note change in reference range. REPEATED TO VERIFY CRITICAL RESULT CALLED TO, READ BACK BY AND VERIFIED WITH: HAYES R,RN 07/25/14 0005 WAYK    Chloride 111 96 - 112 mEq/L   CO2 15 (L) 19 - 32 mmol/L   Glucose, Bld 274 (H) 70 - 99 mg/dL   BUN 85 (H) 6 -  23 mg/dL   Creatinine, Ser 5.43 (H) 0.50 - 1.10 mg/dL   Calcium 8.5 8.4 - 10.5 mg/dL   Total Protein 5.0 (L) 6.0 - 8.3 g/dL   Albumin 2.9 (L) 3.5 - 5.2 g/dL   AST 158 (H) 0 - 37 U/L   ALT 171 (H) 0 - 35 U/L   Alkaline Phosphatase 40 39 - 117 U/L   Total Bilirubin 0.5 0.3 - 1.2 mg/dL   GFR calc non Af Amer 7 (L) >90 mL/min   GFR calc Af Amer 8 (L) >90 mL/min    Comment: (NOTE) The eGFR has been calculated using the CKD EPI equation. This calculation has not been validated in all clinical situations. eGFR's persistently <90 mL/min signify possible Chronic Kidney Disease.    Anion gap 10 5 - 15  Magnesium     Status: None    Collection Time: 07/24/14 10:57 PM  Result Value Ref Range   Magnesium 1.9 1.5 - 2.5 mg/dL  Phosphorus     Status: Abnormal   Collection Time: 07/24/14 10:57 PM  Result Value Ref Range   Phosphorus 6.6 (H) 2.3 - 4.6 mg/dL  Troponin I     Status: Abnormal   Collection Time: 07/24/14 10:57 PM  Result Value Ref Range   Troponin I 0.13 (H) <0.031 ng/mL    Comment:        PERSISTENTLY INCREASED TROPONIN VALUES IN THE RANGE OF 0.04-0.49 ng/mL CAN BE SEEN IN:       -UNSTABLE ANGINA       -CONGESTIVE HEART FAILURE       -MYOCARDITIS       -CHEST TRAUMA       -ARRYHTHMIAS       -LATE PRESENTING MYOCARDIAL INFARCTION       -COPD   CLINICAL FOLLOW-UP RECOMMENDED. Please note change in reference range.   TSH     Status: Abnormal   Collection Time: 07/24/14 10:57 PM  Result Value Ref Range   TSH 6.492 (H) 0.350 - 4.500 uIU/mL  Brain natriuretic peptide     Status: Abnormal   Collection Time: 07/24/14 10:57 PM  Result Value Ref Range   B Natriuretic Peptide 126.6 (H) 0.0 - 100.0 pg/mL    Comment: Please note change in reference range.  Lactic acid, plasma     Status: Abnormal   Collection Time: 07/24/14 10:57 PM  Result Value Ref Range   Lactic Acid, Venous 2.3 (H) 0.5 - 2.2 mmol/L  I-STAT, chem 8     Status: Abnormal   Collection Time: 07/24/14 10:57 PM  Result Value Ref Range   Sodium 134 (L) 135 - 145 mmol/L   Potassium 6.7 (HH) 3.5 - 5.1 mmol/L   Chloride 109 96 - 112 mEq/L   BUN 109 (H) 6 - 23 mg/dL   Creatinine, Ser 5.50 (H) 0.50 - 1.10 mg/dL   Glucose, Bld 267 (H) 70 - 99 mg/dL   Calcium, Ion 1.27 1.13 - 1.30 mmol/L   TCO2 14 0 - 100 mmol/L   Hemoglobin 10.9 (L) 12.0 - 15.0 g/dL   HCT 32.0 (L) 36.0 - 46.0 %   Comment NOTIFIED PHYSICIAN   I-STAT 3, arterial blood gas (G3+)     Status: Abnormal   Collection Time: 07/24/14 11:30 PM  Result Value Ref Range   pH, Arterial 7.142 (LL) 7.350 - 7.450   pCO2 arterial 40.6 35.0 - 45.0 mmHg   pO2, Arterial 405.0 (H) 80.0 - 100.0  mmHg   Bicarbonate 13.9 (  L) 20.0 - 24.0 mEq/L   TCO2 15 0 - 100 mmol/L   O2 Saturation 100.0 %   Acid-base deficit 14.0 (H) 0.0 - 2.0 mmol/L   Patient temperature 98.2 F    Collection site ARTERIAL LINE    Drawn by RT    Sample type ARTERIAL    Comment NOTIFIED PHYSICIAN    Dg Chest Port 1 View  07/24/2014   CLINICAL DATA:  Intubation  EXAM: PORTABLE CHEST - 1 VIEW  COMPARISON:  Same date, 5:32 p.m.  FINDINGS: Endotracheal tube tip is at the proximal right mainstem bronchus. Pacing pad overlies the chest. Cardiac leads obscure detail. Mild cardiomegaly noted with central vascular congestion. Trace pleural effusions. Borderline interstitial edema noted.  IMPRESSION: Endotracheal tube tip at the right mainstem bronchus, consider pulling back 2 cm for more optimal positioning.  Critical Value/emergent results were called by telephone at the time of interpretation on 07/24/2014 at 11:00 pm to RN Pearson Grippe for Dr. Montey Hora , who verbally acknowledged these results.   Electronically Signed   By: Conchita Paris M.D.   On: 07/24/2014 23:00    Assessment: 1 Hyperkalemia (exogenous, decreased excretion) 2 Renal Failure (?acute and chronic) ? DM, HBP, Sarcoid 3 Complete heart block prob due to #1 Plan: 1 Urgent dialysis 2 Renal work up   [facility] C 07/25/2014, 12:37 AM

## 2014-07-25 NOTE — Progress Notes (Signed)
PULMONARY / CRITICAL CARE MEDICINE   Name: Leah French MRN: [id number] DOB: 1946/07/30    ADMISSION DATE:  07/24/2014  REFERRING MD :  [facility]  CHIEF COMPLAINT:  Complete heart block, acute renal failure, hyperkalemia  INITIAL PRESENTATION:  68 y.o. F, [facility] with generalized weakness.  In ED, found to have complete heart block with HR in 20's, acute renal failure, hyperkalemia.  She was paced transcutaneously and [facility].     STUDIES:  CXR 12/24 >>> right basilar opacification  SIGNIFICANT EVENTS: [facility]24 - [facility] with 3rd degree heart block, acute renal failure, hyperkalemia.  SUBJECTIVE:  Remains on DA.  VITAL SIGNS: Temp:  [98.2 F (36.8 C)-98.8 F (37.1 C)] 98.2 F (36.8 C) (12/25 0400) Pulse Rate:  [50-119] 78 (12/25 0630) Resp:  [13-26] 14 (12/25 0630) BP: (92-182)/(46-140) 147/65 mmHg (12/25 0432) SpO2:  [96 %-100 %] 100 % (12/25 0630) Arterial Line BP: (76-141)/(48-73) 106/52 mmHg (12/25 0630) FiO2 (%):  [40 %] 40 % (12/25 0432) Weight:  [202 lb 6.1 oz (91.8 kg)-216 lb 11.4 oz (98.3 kg)] 202 lb 6.1 oz (91.8 kg) (12/25 0400) HEMODYNAMICS:   VENTILATOR SETTINGS: Vent Mode:  [-] PCV FiO2 (%):  [40 %] 40 % Set Rate:  [14 bmp-16 bmp] 14 bmp Vt Set:  [450 mL] 450 mL PEEP:  [5 cmH20] 5 cmH20 Plateau Pressure:  [21 cmH20-24 cmH20] 22 cmH20 INTAKE / OUTPUT: Intake/Output      12/24 0701 - 12/25 0700 12/25 0701 - 12/26 0700   I.V. (mL/kg) 1082.1 (11.8)    Total Intake(mL/kg) 1082.1 (11.8)    Urine (mL/kg/hr) 390    Total Output 390     Net +692.1            PHYSICAL EXAMINATION: General: no distress Neuro: RASS -3  HEENT: ETT in place Cardiovascular: regular Lungs: no wheeze Abdomen: soft, non tender Musculoskeletal: 1+ edema Skin: no rashes  LABS:  CBC  Recent Labs Lab 07/24/14 2257 07/25/14 0045  WBC 12.9* 9.9  HGB 10.5*  10.9* 11.3*  HCT  32.7*  32.0* 34.9*  PLT 187 208   BMET  Recent Labs Lab 07/24/14 2257 07/25/14 0045 07/25/14 0440  NA 136  134* 135 138  K 6.6*  6.7* 6.6* 3.7  CL 111  109 110 101  CO2 15* 15* 26  BUN 85*  109* 85* 17  CREATININE 5.43*  5.50* 5.27* 1.74*  GLUCOSE 274*  267* 150* 179*   Electrolytes  Recent Labs Lab 07/24/14 2257 07/25/14 0045 07/25/14 0440  CALCIUM 8.5 9.2 8.1*  MG 1.9 1.9  --   PHOS 6.6* 5.4*  --    Sepsis Markers  Recent Labs Lab 07/24/14 2257  LATICACIDVEN 2.3*   ABG  Recent Labs Lab 07/24/14 2330  PHART 7.142*  PCO2ART 40.6  PO2ART 405.0*   Liver Enzymes  Recent Labs Lab 07/24/14 2257  AST 158*  ALT 171*  ALKPHOS 40  BILITOT 0.5  ALBUMIN 2.9*   Cardiac Enzymes  Recent Labs Lab 07/24/14 2257  TROPONINI 0.13*   Glucose  Recent Labs Lab 07/25/14 0143 07/25/14 0404  GLUCAP 133* 138*    Imaging Dg Chest Port 1 View  07/24/2014   CLINICAL DATA:  Intubation  EXAM: PORTABLE CHEST - 1 VIEW  COMPARISON:  Same date, 5:32 p.m.  FINDINGS: Endotracheal tube tip is at the proximal right mainstem bronchus. Pacing pad overlies the chest.  Cardiac leads obscure detail. Mild cardiomegaly noted with central vascular congestion. Trace pleural effusions. Borderline interstitial edema noted.  IMPRESSION: Endotracheal tube tip at the right mainstem bronchus, consider pulling back 2 cm for more optimal positioning.  Critical Value/emergent results were called by telephone at the time of interpretation on 07/24/2014 at 11:00 pm to RN Youlanda MightyShondra for Dr. Rutherford GuysAHUL DESAI , who verbally acknowledged these results.   Electronically Signed   By: Christiana PellantGretchen  Green M.D.   On: 07/24/2014 23:00     ASSESSMENT / PLAN:  PULMONARY ETT 12/24 >>> A: Acute hypoxic respiratory failure 2nd to metabolic derangements. Hx COPD, Sarcoid on chronic prednisone. P:   Full vent support, wean as able. VAP bundle. Daily SBT if / when meets criteria. DuoNebs / Albuterol  PRN. Budesonide / Brovana. Hold outpatient Advair, Dulera, Prednisone. F/u CXR, ABG  CARDIOVASCULAR L IJ CVL 12/24 >>> A line 12/24 >>> A:  Brief asystolic arrest, complete heart block, cardiogenic shock 2nd to acidosis/hyperkalemia - resolved Hx CAD, HTN, HLD, MI, valvular heart disease P:  Wean of DA to keep MAP > 65 Hold outpatient simvastatin, lasix, lisinopril, metoprolol.  RENAL A:   Acute renal failure with metabolic acidosis/hyperkalemia. P:   HD per renal D/c HCO3 in IV fluid  GASTROINTESTINAL A:   GERD. Nutrition. P:   SUP: Pantoprazole. Tube feeds while on vent  HEMATOLOGIC A:   Mild anemia VTE Prophylaxis P:  F/u CBC SQ heparin for DVT prevention  INFECTIOUS A:   No indication for infection. P:   Monitor clinically.  ENDOCRINE A:   DM type 2. P:   SSI Stress dose steroids >> transition to prednisone when more stable  NEUROLOGIC A:   Acute metabolic encephalopathy. P:   RASS goal -1  Family updated: Family updated at length multiple times.  Interdisciplinary Family Meeting v Palliative Care Meeting:  Due by: 12/31.  CC time 35 minutes  Coralyn HellingVineet Rei Contee, MD [facility] 07/25/2014, 7:50 AM Pager:  [phone] After 3pm call: [phone]

## 2014-07-25 NOTE — Progress Notes (Signed)
CRITICAL VALUE ALERT  Critical value received:  Potassium 6.7  Date of notification:  07-24-14  Time of notification:  2300  Critical value read back:Yes.    Nurse who received alert:  Madilyn FiremanHayes, RN  MD notified: Ephriam Knucklesaul Desi PA for CCM and Dr. Molli KnockYacoub at bedside at 2300

## 2014-07-26 DIAGNOSIS — I442 Atrioventricular block, complete: Secondary | ICD-10-CM

## 2014-07-26 LAB — RENAL FUNCTION PANEL
ALBUMIN: 2.5 g/dL — AB (ref 3.5–5.2)
ANION GAP: 11 (ref 5–15)
ANION GAP: 9 (ref 5–15)
Albumin: 2.7 g/dL — ABNORMAL LOW (ref 3.5–5.2)
BUN: 18 mg/dL (ref 6–23)
BUN: 18 mg/dL (ref 6–23)
CALCIUM: 7.5 mg/dL — AB (ref 8.4–10.5)
CHLORIDE: 107 meq/L (ref 96–112)
CO2: 27 mmol/L (ref 19–32)
CO2: 26 mmol/L (ref 19–32)
Calcium: 7.2 mg/dL — ABNORMAL LOW (ref 8.4–10.5)
Chloride: 102 mEq/L (ref 96–112)
Creatinine, Ser: 2.09 mg/dL — ABNORMAL HIGH (ref 0.50–1.10)
Creatinine, Ser: 1.95 mg/dL — ABNORMAL HIGH (ref 0.50–1.10)
GFR calc Af Amer: 29 mL/min — ABNORMAL LOW (ref >90)
GFR calc non Af Amer: 23 mL/min — ABNORMAL LOW (ref >90)
GFR calc non Af Amer: 25 mL/min — ABNORMAL LOW (ref >90)
GFR, EST AFRICAN AMERICAN: 27 mL/min — AB (ref >90)
GLUCOSE: 249 mg/dL — AB (ref 70–99)
Glucose, Bld: 139 mg/dL — ABNORMAL HIGH (ref 70–99)
POTASSIUM: 2.8 mmol/L — AB (ref 3.5–5.1)
POTASSIUM: 3.6 mmol/L (ref 3.5–5.1)
Phosphorus: 1.9 mg/dL — ABNORMAL LOW (ref 2.3–4.6)
Phosphorus: 3.1 mg/dL (ref 2.3–4.6)
SODIUM: 140 mmol/L (ref 135–145)
Sodium: 142 mmol/L (ref 135–145)

## 2014-07-26 LAB — GLUCOSE, CAPILLARY
GLUCOSE-CAPILLARY: 242 mg/dL — AB (ref 70–99)
GLUCOSE-CAPILLARY: 167 mg/dL — AB (ref 70–99)
GLUCOSE-CAPILLARY: 158 mg/dL — AB (ref 70–99)
Glucose-Capillary: 96 mg/dL (ref 70–99)

## 2014-07-26 LAB — CBC
HCT: 29.6 % — ABNORMAL LOW (ref 36.0–46.0)
HEMOGLOBIN: 10.2 g/dL — AB (ref 12.0–15.0)
MCH: 29.1 pg (ref 26.0–34.0)
MCHC: 34.5 g/dL (ref 30.0–36.0)
MCV: 84.6 fL (ref 78.0–100.0)
Platelets: 145 10*3/uL — ABNORMAL LOW (ref 150–400)
RBC: 3.5 MIL/uL — ABNORMAL LOW (ref 3.87–5.11)
RDW: 16.6 % — ABNORMAL HIGH (ref 11.5–15.5)
WBC: 8.5 10*3/uL (ref 4.0–10.5)

## 2014-07-26 LAB — BLOOD GAS, ARTERIAL
ACID-BASE EXCESS: 2.9 mmol/L — AB (ref 0.0–2.0)
Bicarbonate: 25.3 mEq/L — ABNORMAL HIGH (ref 20.0–24.0)
DRAWN BY: 41881
FIO2: 0.4 %
LHR: 14 {breaths}/min
O2 SAT: 99.2 %
PEEP/CPAP: 5 cmH2O
Patient temperature: 99.6
Pressure control: 20 cmH2O
TCO2: 26.2 mmol/L (ref 0–100)
pCO2 arterial: 28.9 mmHg — ABNORMAL LOW (ref 35.0–45.0)
pH, Arterial: 7.553 — ABNORMAL HIGH (ref 7.350–7.450)
pO2, Arterial: 178 mmHg — ABNORMAL HIGH (ref 80.0–100.0)

## 2014-07-26 MED ORDER — POTASSIUM CHLORIDE 20 MEQ/15ML (10%) PO SOLN
40.0000 meq | Freq: Once | ORAL | Status: AC
  Administered 2014-07-26: 40 meq
  Filled 2014-07-26: qty 30

## 2014-07-26 MED ORDER — INSULIN ASPART 100 UNIT/ML ~~LOC~~ SOLN
0.0000 [IU] | Freq: Three times a day (TID) | SUBCUTANEOUS | Status: DC
Start: 2014-07-26 — End: 2014-07-29
  Administered 2014-07-26: 1 [IU] via SUBCUTANEOUS
  Administered 2014-07-26 (×2): 2 [IU] via SUBCUTANEOUS

## 2014-07-26 MED ORDER — PANTOPRAZOLE SODIUM 40 MG PO TBEC
40.0000 mg | DELAYED_RELEASE_TABLET | Freq: Every day | ORAL | Status: DC
  Administered 2014-07-26 – 2014-07-29 (×4): 40 mg via ORAL
  Filled 2014-07-26 (×4): qty 1

## 2014-07-26 NOTE — Procedures (Signed)
Extubation Procedure Note  Patient Details:   Name: Leah French DOB: 1946-07-24 MRN: [id number]   Airway Documentation:  AIRWAYS (Active)  Secured at (cm) 24 cm 07/24/2014 12:00 AM    Evaluation  O2 sats: stable throughout Complications: No apparent complications Patient did tolerate procedure well. Bilateral Breath Sounds: Clear, Diminished Suctioning: Airway Yes  Pt was extubated per MD order. Pt had a positive cuff leak. Placed on 2 LPM nasal cannula. SPO2 88 increased to 4 LPM. Pt had slight rhonchi in upper lungs. Pt had good strong productive cough and able to clearly speak name. No stridor noted. RT will continue to monitor.   Shaylin Blatt M 07/26/2014, 8:41 AM

## 2014-07-26 NOTE — Progress Notes (Signed)
eLink Physician-Brief Progress Note Patient Name: Leah French DOB: May 07, 1946 MRN: [id number]   Date of Service  07/26/2014  HPI/Events of Note  Hypokalemia in the setting of ESRD  eICU Interventions  Plan: Potassium replaced 40  mEq     Intervention Category Intermediate Interventions: Electrolyte abnormality - evaluation and management  Hila Bolding 07/26/2014, 5:36 AM

## 2014-07-26 NOTE — Progress Notes (Signed)
Echocardiogram 2D Echocardiogram has been performed.  Leah French, Leah French 07/26/2014, 12:32 PM

## 2014-07-26 NOTE — Progress Notes (Signed)
Pt has vomited twice since extubation. Pt denies nausea but unable to eat when lunch and dinner brought. Pt swallowed without difficulty but continues to have difficulty with eating.

## 2014-07-26 NOTE — Progress Notes (Signed)
PULMONARY / CRITICAL CARE MEDICINE   Name: Leah French MRN: [id number] DOB: Mar 25, 1946    ADMISSION DATE:  07/24/2014  [facility] :  [facility]  CHIEF COMPLAINT:  Complete heart block, acute renal failure, hyperkalemia  INITIAL PRESENTATION:  68 y.o. F, [facility] with generalized weakness.  In ED, found to have complete heart block with HR in 20's, acute renal failure, hyperkalemia.  She was paced transcutaneously and [facility].     STUDIES:  CXR 12/24 >>> right basilar opacification  SIGNIFICANT EVENTS: 12/24 - [facility] with 3rd degree heart block, acute renal failure, hyperkalemia. 12/25 - cardiology sign off  SUBJECTIVE:  Tolerating pressure support.  Weaning off levophed  VITAL SIGNS: Temp:  [98.6 F (37 C)-101.3 F (38.5 C)] 99.6 F (37.6 C) (12/25 2200) Pulse Rate:  [74-113] 74 (12/26 0757) Resp:  [14-33] 24 (12/26 0757) BP: (95-123)/(47-65) 95/65 mmHg (12/25 1226) SpO2:  [97 %-100 %] 97 % (12/26 0757) Arterial Line BP: (85-170)/(45-76) 129/67 mmHg (12/26 0700) FiO2 (%):  [40 %] 40 % (12/26 0757) Weight:  [203 lb (92.08 kg)] 203 lb (92.08 kg) (12/26 0500) HEMODYNAMICS: CVP:  [7 mmHg-11 mmHg] 10 mmHg VENTILATOR SETTINGS: Vent Mode:  [-] PCV FiO2 (%):  [40 %] 40 % Set Rate:  [14 bmp] 14 bmp PEEP:  [5 cmH20] 5 cmH20 Plateau Pressure:  [21 cmH20-22 cmH20] 22 cmH20 INTAKE / OUTPUT: Intake/Output      12/25 0701 - 12/26 0700 12/26 0701 - 12/27 0700   I.V. (mL/kg) 2401.5 (26.1)    NG/GT 670    Total Intake(mL/kg) 3071.5 (33.4)    Urine (mL/kg/hr) 2075 (0.9)    Total Output 2075     Net +996.5          Stool Occurrence 1 x      PHYSICAL EXAMINATION: General: no distress Neuro: RASS 0, follows commands HEENT: ETT in place Cardiovascular: regular Lungs: no wheeze Abdomen: soft, non tender Musculoskeletal: 1+ edema Skin: no rashes  LABS:  CBC  Recent  Labs Lab 07/24/14 2257 07/25/14 0045 07/26/14 0350  WBC 12.9* 9.9 8.5  HGB 10.5*  10.9* 11.3* 10.2*  HCT 32.7*  32.0* 34.9* 29.6*  PLT 187 208 145*   BMET  Recent Labs Lab 07/25/14 0045 07/25/14 0440 07/26/14 0350  NA 135 138 140  K 6.6* 3.7 2.8*  CL 110 101 102  CO2 15* 26 27  BUN 85* 17 18  CREATININE 5.27* 1.74* 2.09*  GLUCOSE 150* 179* 249*   Electrolytes  Recent Labs Lab 07/24/14 2257 07/25/14 0045 07/25/14 0440 07/26/14 0350  CALCIUM 8.5 9.2 8.1* 7.5*  MG 1.9 1.9  --   --   PHOS 6.6* 5.4*  --  1.9*   Sepsis Markers  Recent Labs Lab 07/24/14 2257  LATICACIDVEN 2.3*   ABG  Recent Labs Lab 07/24/14 2330 07/26/14 0330  PHART 7.142* 7.553*  PCO2ART 40.6 28.9*  PO2ART 405.0* 178.0*   Liver Enzymes  Recent Labs Lab 07/24/14 2257 07/26/14 0350  AST 158*  --   ALT 171*  --   ALKPHOS 40  --   BILITOT 0.5  --   ALBUMIN 2.9* 2.7*   Cardiac Enzymes  Recent Labs Lab 07/24/14 2257  TROPONINI 0.13*   Glucose  Recent Labs Lab 07/25/14 0819 07/25/14 1216 07/25/14 1542 07/25/14 2007 07/25/14 2327 07/26/14 0351  GLUCAP 223* 143* 172* 208* 262* 242*    Imaging  US Renal Port  07/25/2014   Lauris Poag, MD     07/25/2014 12:57 AM Emergent dialysis initiated for treatment of hyperkalemia which  is symptomatic.  Line was placed by CCM. Pt being paced  transcutaneously. POWELL,ALVIN C   Dg Chest Port 1 View  07/25/2014   CLINICAL DATA:  Endotracheal tube placement. Acute respiratory failure.  EXAM: PORTABLE CHEST - 1 VIEW  COMPARISON:  07/25/2014 at 12:32 a.m.  FINDINGS: Endotracheal tube tip 3.5 cm above the carina. Right IJ line tip: SVC. Left subclavian line tip: Cavoatrial junction. Nasogastric tube enters the stomach.  No pneumothorax. Borderline cardiomegaly. Perihilar interstitial accentuation mildly improved compared to the earlier exam.  IMPRESSION: 1. Improved perihilar interstitial accentuation compared to the earlier exam. Tubes  and lines appear satisfactorily positioned.   Electronically Signed   By: Herbie Baltimore M.D.   On: 07/25/2014 09:05   Dg Chest Port 1 View  07/25/2014   CLINICAL DATA:  Tube and line adjustment, central line placement  EXAM: PORTABLE CHEST - 1 VIEW  COMPARISON:  07/24/2014 at 11:49 p.m.  FINDINGS: Endotracheal tube now terminates 1.6 cm above the carina. Nasogastric tube tip terminates below the level of the hemidiaphragms but is not included in the field of view. Left subclavian approach dual lumen catheter tips terminate at the cavoatrial junction. New right IJ approach central line tip terminates over the distal SVC. No pneumothorax. No other change.  IMPRESSION: Tube and line placement as above.   Electronically Signed   By: Christiana Pellant M.D.   On: 07/25/2014 01:33   Dg Chest Port 1 View  07/25/2014   CLINICAL DATA:  Central line placement  EXAM: PORTABLE CHEST - 1 VIEW  COMPARISON:  07/24/2014 at 10:47 p.m.  FINDINGS: Endotracheal tube tip remains at the proximal right mainstem bronchus and could be withdrawn 2 cm for more optimal positioning. Interval placement of left subclavian approach central line with tip at the cavoatrial junction. No pneumothorax. No change otherwise.  IMPRESSION: Malpositioned endotracheal tube with tip at the right mainstem bronchus.  Left subclavian central line with tip over the cavoatrial junction.  Critical Value/emergent results were called by telephone at the time of interpretation on 07/25/2014 at 12:36 am to [facility] for Dr. Koren Bound , who verbally acknowledged these results.   Electronically Signed   By: Christiana Pellant M.D.   On: 07/25/2014 00:36     ASSESSMENT / PLAN:  PULMONARY ETT 12/24 >>> A: Acute hypoxic respiratory failure 2nd to metabolic derangements. Hx COPD, Sarcoid on chronic prednisone. P:   Full vent support, wean as able. VAP bundle. Daily SBT if / when meets criteria. DuoNebs / Albuterol PRN. Budesonide / Brovana. Hold  outpatient Advair, Dulera, Prednisone. F/u CXR, ABG  CARDIOVASCULAR Rt IJ CVL 12/24 >>> Rt radial A line 12/24 >>> Lt [place] HD cath 12/25 >>> A:  Brief asystolic arrest, complete heart block, cardiogenic shock 2nd to acidosis/hyperkalemia - resolved. Hx CAD, HTN, HLD, MI, valvular heart disease. P:  Wean off levophed to keep MAP > 65 Hold outpatient simvastatin, lasix, lisinopril, metoprolol.  RENAL A:   Acute renal failure with metabolic acidosis/hyperkalemia >> much improved. Hypokalemia developed 12/26. P:   Monitor off HD  GASTROINTESTINAL A:   GERD. Nutrition. P:   SUP: Pantoprazole. Advance diet after extubation  HEMATOLOGIC A:   Mild anemia. Thrombocytopenia. P:  F/u CBC SQ heparin for DVT prevention  INFECTIOUS A:   No indication for infection. P:   Monitor  clinically.  ENDOCRINE A:   DM type 2. Relative adrenal insufficiency >> on chronic prednisone as outpt. P:   SSI Stress dose steroids >> transition to prednisone when more stable  NEUROLOGIC A:   Acute metabolic encephalopathy >> much improved. P:   RASS goal 0  Updated pt's family at bedside 12/26.  Interdisciplinary Family Meeting v Palliative Care Meeting:  Due by: 12/31.  CC time 35 minutes  Coralyn HellingVineet Venisha Boehning, MD [facility] 07/26/2014, 7:58 AM Pager:  [phone] After 3pm call: [phone]

## 2014-07-27 ENCOUNTER — Encounter (HOSPITAL_COMMUNITY): Payer: Self-pay | Admitting: Family Medicine

## 2014-07-27 LAB — CBC
HEMATOCRIT: 28.6 % — AB (ref 36.0–46.0)
HEMOGLOBIN: 8.9 g/dL — AB (ref 12.0–15.0)
MCH: 28.3 pg (ref 26.0–34.0)
MCHC: 31.1 g/dL (ref 30.0–36.0)
MCV: 90.8 fL (ref 78.0–100.0)
Platelets: 103 10*3/uL — ABNORMAL LOW (ref 150–400)
RBC: 3.15 MIL/uL — ABNORMAL LOW (ref 3.87–5.11)
RDW: 17.5 % — ABNORMAL HIGH (ref 11.5–15.5)
WBC: 5.7 10*3/uL (ref 4.0–10.5)

## 2014-07-27 LAB — RENAL FUNCTION PANEL
ALBUMIN: 2.3 g/dL — AB (ref 3.5–5.2)
Anion gap: 7 (ref 5–15)
BUN: 16 mg/dL (ref 6–23)
CO2: 27 mmol/L (ref 19–32)
CREATININE: 1.79 mg/dL — AB (ref 0.50–1.10)
Calcium: 7.3 mg/dL — ABNORMAL LOW (ref 8.4–10.5)
Chloride: 111 mEq/L (ref 96–112)
GFR calc Af Amer: 32 mL/min — ABNORMAL LOW (ref >90)
GFR calc non Af Amer: 28 mL/min — ABNORMAL LOW (ref >90)
Glucose, Bld: 95 mg/dL (ref 70–99)
Phosphorus: 3.1 mg/dL (ref 2.3–4.6)
Potassium: 4.1 mmol/L (ref 3.5–5.1)
Sodium: 145 mmol/L (ref 135–145)

## 2014-07-27 LAB — GLUCOSE, CAPILLARY
GLUCOSE-CAPILLARY: 107 mg/dL — AB (ref 70–99)
GLUCOSE-CAPILLARY: 95 mg/dL (ref 70–99)
Glucose-Capillary: 138 mg/dL — ABNORMAL HIGH (ref 70–99)
Glucose-Capillary: 102 mg/dL — ABNORMAL HIGH (ref 70–99)
Glucose-Capillary: 99 mg/dL (ref 70–99)

## 2014-07-27 LAB — MAGNESIUM: Magnesium: 1.6 mg/dL (ref 1.5–2.5)

## 2014-07-27 MED ORDER — CETYLPYRIDINIUM CHLORIDE 0.05 % MT LIQD
7.0000 mL | Freq: Two times a day (BID) | OROMUCOSAL | Status: DC
  Administered 2014-07-27 – 2014-07-28 (×3): 7 mL via OROMUCOSAL

## 2014-07-27 MED ORDER — METHOCARBAMOL 750 MG PO TABS
750.0000 mg | ORAL_TABLET | Freq: Three times a day (TID) | ORAL | Status: DC | PRN
  Filled 2014-07-27: qty 1

## 2014-07-27 MED ORDER — ASPIRIN EC 81 MG PO TBEC
81.0000 mg | DELAYED_RELEASE_TABLET | Freq: Every day | ORAL | Status: DC
  Administered 2014-07-27 – 2014-07-29 (×3): 81 mg via ORAL
  Filled 2014-07-27 (×3): qty 1

## 2014-07-27 MED ORDER — CITALOPRAM HYDROBROMIDE 20 MG PO TABS
20.0000 mg | ORAL_TABLET | Freq: Every day | ORAL | Status: DC
  Administered 2014-07-27 – 2014-07-29 (×3): 20 mg via ORAL
  Filled 2014-07-27 (×3): qty 1

## 2014-07-27 MED ORDER — SODIUM CHLORIDE 0.9 % IV SOLN: 250.0000 mL | INTRAVENOUS | Status: DC

## 2014-07-27 MED ORDER — ACETAMINOPHEN 500 MG PO TABS
500.0000 mg | ORAL_TABLET | Freq: Four times a day (QID) | ORAL | Status: DC | PRN
  Administered 2014-07-27 – 2014-07-29 (×3): 500 mg via ORAL
  Filled 2014-07-27 (×3): qty 1

## 2014-07-27 MED ORDER — PREDNISONE 10 MG PO TABS
10.0000 mg | ORAL_TABLET | Freq: Every day | ORAL | Status: DC
  Administered 2014-07-27 – 2014-07-29 (×3): 10 mg via ORAL
  Filled 2014-07-27 (×4): qty 1

## 2014-07-27 MED ORDER — SIMVASTATIN 40 MG PO TABS
40.0000 mg | ORAL_TABLET | Freq: Every day | ORAL | Status: DC
  Administered 2014-07-27 – 2014-07-28 (×2): 40 mg via ORAL
  Filled 2014-07-27 (×3): qty 1

## 2014-07-27 NOTE — Progress Notes (Signed)
PULMONARY / CRITICAL CARE MEDICINE   Name: Leah BolsterShirley M Arneson MRN: [id number] DOB: Apr 11, 1946    ADMISSION DATE:  07/24/2014  [facility] :  [facility]  CHIEF COMPLAINT:  Complete heart block, acute renal failure, hyperkalemia  INITIAL PRESENTATION:  68 y.o. F, [facility] 12/24 with generalized weakness.  In ED, found to have complete heart block with HR in 20's, acute renal failure, hyperkalemia.  She was paced transcutaneously and [facility] for further evaluation.     STUDIES:  Echo 12/26 >> EF 60 to 65%, grade 1 diastolic dysfx  SIGNIFICANT EVENTS: 12/24 - [facility] with 3rd degree heart block, acute renal failure, hyperkalemia. 12/25 - cardiology sign off 12/27 - transfer to SDU  SUBJECTIVE:  Felt nauseous last night.  Gets intermittent stridor at home >> happens when she feels anxious.  Denies chest/abd pain.  VITAL SIGNS: Temp:  [98.3 F (36.8 C)-98.9 F (37.2 C)] 98.6 F (37 C) (12/27 0305) Pulse Rate:  [74-117] 79 (12/27 0600) Resp:  [13-26] 17 (12/27 0600) BP: (99-170)/(42-134) 99/42 mmHg (12/26 2006) SpO2:  [89 %-100 %] 90 % (12/27 0600) Arterial Line BP: (70-135)/(39-73) 115/53 mmHg (12/27 0600) FiO2 (%):  [40 %] 40 % (12/26 0800) Weight:  [207 lb 7.3 oz (94.1 kg)] 207 lb 7.3 oz (94.1 kg) (12/27 0500) INTAKE / OUTPUT: Intake/Output      12/26 0701 - 12/27 0700 12/27 0701 - 12/28 0700   I.V. (mL/kg) 1817.8 (19.3)    NG/GT 30    Total Intake(mL/kg) 1847.8 (19.6)    Urine (mL/kg/hr) 760 (0.3)    Total Output 760     Net +1087.8          Emesis Occurrence 2 x      PHYSICAL EXAMINATION: General: no distress Neuro: follows commands HEENT: mild stridor Cardiovascular: regular Lungs: no wheeze Abdomen: soft, non tender Musculoskeletal: 1+ edema Skin: no rashes  LABS:  CBC  Recent Labs Lab 07/24/14 2257 07/25/14 0045 07/26/14 0350  WBC 12.9* 9.9 8.5  HGB 10.5*  10.9* 11.3* 10.2*  HCT  32.7*  32.0* 34.9* 29.6*  PLT 187 208 145*   BMET  Recent Labs Lab 07/26/14 0350 07/26/14 1400 07/27/14 0500  NA 140 142 145  K 2.8* 3.6 4.1  CL 102 107 111  CO2 27 26 27   BUN 18 18 16   CREATININE 2.09* 1.95* 1.79*  GLUCOSE 249* 139* 95   Electrolytes  Recent Labs Lab 07/24/14 2257 07/25/14 0045  07/26/14 0350 07/26/14 1400 07/27/14 0500  CALCIUM 8.5 9.2  < > 7.5* 7.2* 7.3*  MG 1.9 1.9  --   --   --  1.6  PHOS 6.6* 5.4*  --  1.9* 3.1 3.1  < > = values in this interval not displayed.   Sepsis Markers  Recent Labs Lab 07/24/14 2257  LATICACIDVEN 2.3*   ABG  Recent Labs Lab 07/24/14 2330 07/26/14 0330  PHART 7.142* 7.553*  PCO2ART 40.6 28.9*  PO2ART 405.0* 178.0*   Liver Enzymes  Recent Labs Lab 07/24/14 2257 07/26/14 0350 07/26/14 1400 07/27/14 0500  AST 158*  --   --   --   ALT 171*  --   --   --   ALKPHOS 40  --   --   --   BILITOT 0.5  --   --   --   ALBUMIN 2.9* 2.7* 2.5* 2.3*   Cardiac Enzymes  Recent Labs Lab 07/24/14 2257  TROPONINI 0.13*   Glucose  Recent Labs Lab 07/26/14 0351 07/26/14 0808 07/26/14 1115 07/26/14 1650 07/26/14 2115 07/27/14 0731  GLUCAP 242* 167* 158* 138* 96 102*    Imaging No results found.   ASSESSMENT / PLAN:  PULMONARY ETT 12/24 >>> 12/26 A: Acute hypoxic respiratory failure 2nd to metabolic derangements. Hx COPD, Sarcoid on chronic prednisone. Intermittent stridor. P:   Continue brovana, pulmicort with prn albuterol for now Hold outpt ICS/LABA inhaler ?dulera or advair Resume prednisone 12/27 F/u CXR as needed Will ask speech therapy to assess for stridor/ VCD  CARDIOVASCULAR Rt IJ CVL 12/24 >>> 12/27 Rt radial A line 12/24 >>> 12/27 Lt [place] HD cath 12/25 >>> 12/27  A:  Brief asystolic arrest, complete heart block, cardiogenic shock 2nd to acidosis/hyperkalemia - resolved. Hx CAD, HTN, HLD, MI, valvular heart disease. P:  Continue zocor, ASA Hold lasix, metoprolol Might be best  to avoid resume lisinopril due to renal issues and stridor  RENAL A:   Acute renal failure with metabolic acidosis/hyperkalemia >> much improved. Hypokalemia developed 12/26 >> improved 12/27. P:   D/c HD cath Monitor renal fx, urine outpt KVO IV fluids  GASTROINTESTINAL A:   GERD. Nutrition. P:   SUP: Pantoprazole. Carb modified diet  HEMATOLOGIC A:   Mild anemia. Thrombocytopenia. P:  F/u CBC intermittently SQ heparin for DVT prevention  INFECTIOUS A:   No indication for infection. P:   Monitor clinically.  ENDOCRINE A:   DM type 2. Relative adrenal insufficiency >> on chronic prednisone as outpt. P:   SSI D/c solu corterf 12/27  NEUROLOGIC A:   Acute metabolic encephalopathy >> much improved. Anxiety/depression. Deconditioning. P:   Monitor mental status PT Celexa  Updated pt's family at bedside 12/26.  Transfer to SDU 12/27.  Keep on PCCM service since likely ready for d/c home soon.  Coralyn HellingVineet Carla Whilden, MD [facility]/Critical [facility] 07/27/2014, 7:37 AM Pager:  [phone] After 3pm call: [phone]

## 2014-07-27 NOTE — Progress Notes (Signed)
eLink Physician-Brief Progress Note Patient Name: Leah BolsterShirley M French DOB: [birth year] MRN: [id number]   Date of Service  07/27/2014  HPI/Events of Note  Headache, usual severity, typically relieved with tylenol  eICU Interventions  Tylenol 500 mg q 6 h po prn      Intervention Category Minor Interventions: Routine modifications to care plan (e.g. PRN medications for pain, fever)  Sandrea HughsMichael Yerlin Gasparyan 07/27/2014, 5:32 PM

## 2014-07-27 NOTE — Progress Notes (Signed)
Utilization Review Completed.  

## 2014-07-27 NOTE — Progress Notes (Signed)
Patient has had projectile bile emesis x2 before midnight. It happens after a coughing spell. She denies any precipitating symptoms including nausea.

## 2014-07-28 ENCOUNTER — Inpatient Hospital Stay (HOSPITAL_COMMUNITY): Payer: Medicare Other

## 2014-07-28 DIAGNOSIS — Z452 Encounter for adjustment and management of vascular access device: Secondary | ICD-10-CM | POA: Insufficient documentation

## 2014-07-28 DIAGNOSIS — N170 Acute kidney failure with tubular necrosis: Secondary | ICD-10-CM

## 2014-07-28 DIAGNOSIS — N179 Acute kidney failure, unspecified: Secondary | ICD-10-CM | POA: Insufficient documentation

## 2014-07-28 DIAGNOSIS — J9601 Acute respiratory failure with hypoxia: Secondary | ICD-10-CM | POA: Insufficient documentation

## 2014-07-28 DIAGNOSIS — J9600 Acute respiratory failure, unspecified whether with hypoxia or hypercapnia: Secondary | ICD-10-CM | POA: Insufficient documentation

## 2014-07-28 LAB — CBC
HCT: 26.2 % — ABNORMAL LOW (ref 36.0–46.0)
Hemoglobin: 8.4 g/dL — ABNORMAL LOW (ref 12.0–15.0)
MCH: 29.4 pg (ref 26.0–34.0)
MCHC: 32.1 g/dL (ref 30.0–36.0)
MCV: 91.6 fL (ref 78.0–100.0)
Platelets: 109 10*3/uL — ABNORMAL LOW (ref 150–400)
RBC: 2.86 MIL/uL — ABNORMAL LOW (ref 3.87–5.11)
RDW: 17.2 % — AB (ref 11.5–15.5)
WBC: 4.8 10*3/uL (ref 4.0–10.5)

## 2014-07-28 LAB — ANA: Anti Nuclear Antibody(ANA): NEGATIVE

## 2014-07-28 LAB — RENAL FUNCTION PANEL
ALBUMIN: 2.3 g/dL — AB (ref 3.5–5.2)
Anion gap: 6 (ref 5–15)
BUN: 18 mg/dL (ref 6–23)
CO2: 29 mmol/L (ref 19–32)
Calcium: 7.5 mg/dL — ABNORMAL LOW (ref 8.4–10.5)
Chloride: 108 mEq/L (ref 96–112)
Creatinine, Ser: 1.62 mg/dL — ABNORMAL HIGH (ref 0.50–1.10)
GFR calc non Af Amer: 32 mL/min — ABNORMAL LOW (ref >90)
GFR, EST AFRICAN AMERICAN: 37 mL/min — AB (ref >90)
GLUCOSE: 85 mg/dL (ref 70–99)
PHOSPHORUS: 2.5 mg/dL (ref 2.3–4.6)
POTASSIUM: 3.8 mmol/L (ref 3.5–5.1)
SODIUM: 143 mmol/L (ref 135–145)

## 2014-07-28 LAB — GLUCOSE, CAPILLARY
GLUCOSE-CAPILLARY: 104 mg/dL — AB (ref 70–99)
GLUCOSE-CAPILLARY: 112 mg/dL — AB (ref 70–99)
Glucose-Capillary: 77 mg/dL (ref 70–99)
Glucose-Capillary: 97 mg/dL (ref 70–99)

## 2014-07-28 MED ORDER — GUAIFENESIN ER 600 MG PO TB12
1200.0000 mg | ORAL_TABLET | Freq: Two times a day (BID) | ORAL | Status: DC
  Administered 2014-07-28 – 2014-07-29 (×3): 1200 mg via ORAL
  Filled 2014-07-28 (×4): qty 2

## 2014-07-28 NOTE — Care Management Note (Addendum)
    Page 1 of 1   07/29/2014     10:09:14 AM CARE MANAGEMENT NOTE 07/29/2014  Patient:  Alvis,Anayely M   Account Number:  [id number]  Date Initiated:  07/28/2014  Documentation initiated by:  Junius CreamerWELL,DEBBIE  Subjective/Objective Assessment:   adm w heart block     Action/Plan:   lives w fam   Anticipated DC Date:  07/29/2014   Anticipated DC Plan:  HOME W HOME HEALTH SERVICES         [facility]   Choice offered to / List presented to:  C-1 Patient        HH arranged  HH-1 RN  HH-2 PT  HH-3 OT      [facility]   Status of service:   Medicare Important Message given?  YES (If response is "NO", the following Medicare IM given date fields will be blank) Date Medicare IM given:  07/28/2014 Medicare IM given by:  Junius CreamerWELL,DEBBIE Date Additional Medicare IM given:   Additional Medicare IM given by:    Discharge Disposition:  HOME W HOME HEALTH SERVICES  Per UR Regulation:  Reviewed for med. necessity/level of care/duration of stay  If discussed at Long Length of Stay Meetings, dates discussed:   07/29/2014    Comments:  12/29 1007a debbie Kasiah Manka rn,bsn spoke w pt. [place] [facility] of [facility] w [facility] and would like them again. ref to amy for rn-pt-ot. ask amy to have nse ck pt's skin areas noted under breast by staff nse. pt hopes for dc today. states has all eq she needs.

## 2014-07-28 NOTE — Evaluation (Signed)
Physical Therapy Evaluation Patient Details Name: Leah BolsterShirley M Fickle MRN: [id number] DOB: [birth year] Today's Date: 07/28/2014   History of Present Illness  Pt adm with complete heart block and acute renal failure. PMH - CAD, HTN, MI, HLD, valvular heart disease, COPD, Sarcoid, GERD, Arthritis, Depression, Anxiety, DM.    Clinical Impression  Pt admitted with above diagnosis. Pt currently with functional limitations due to the deficits listed below (see PT Problem List).  Pt will benefit from skilled PT to increase their independence and safety with mobility to allow discharge to the venue listed below.  Expect pt will continue to do well and be able to return home with daughter when medically ready for dc.     Follow Up Recommendations Home health PT;Supervision for mobility/OOB    Equipment Recommendations  None recommended by PT    Recommendations for Other Services       Precautions / Restrictions Precautions Precautions: Fall      Mobility  Bed Mobility                  Transfers Overall transfer level: Needs assistance Equipment used: Rolling walker (2 wheeled) Transfers: Sit to/from Stand Sit to Stand: Min guard         General transfer comment: Assist for safety  Ambulation/Gait Ambulation/Gait assistance: Min guard Ambulation Distance (Feet): 150 Feet Assistive device: 4-wheeled walker Gait Pattern/deviations: Step-through pattern;Decreased step length - right;Decreased step length - left;Wide base of support   Gait velocity interpretation: Below normal speed for age/gender General Gait Details: Assist for safety.  Stairs            Wheelchair Mobility    Modified Rankin (Stroke Patients Only)       Balance Overall balance assessment: Needs assistance Sitting-balance support: No upper extremity supported;Feet supported Sitting balance-Leahy Scale: Good     Standing balance support: Bilateral upper extremity supported Standing  balance-Leahy Scale: Poor Standing balance comment: support of walker                             Pertinent Vitals/Pain Pain Assessment: No/denies pain    Home Living Family/patient expects to be discharged to:: Private residence Living Arrangements: Children Available Help at Discharge: Family;Available 24 hours/day Type of Home: House Home Access: Ramped entrance     Home Layout: One level Home Equipment: Walker - 2 wheels;Cane - single point      Prior Function Level of Independence: Independent with assistive device(s)         Comments: amb with cane usually     Hand Dominance        Extremity/Trunk Assessment   Upper Extremity Assessment: Generalized weakness           Lower Extremity Assessment: Generalized weakness         Communication   Communication: No difficulties  Cognition Arousal/Alertness: Awake/alert Behavior During Therapy: Anxious Overall Cognitive Status: Within Functional Limits for tasks assessed                      General Comments      Exercises        Assessment/Plan    PT Assessment Patient needs continued PT services  PT Diagnosis Difficulty walking;Generalized weakness   PT Problem List Decreased strength;Decreased activity tolerance;Decreased balance;Decreased mobility;Obesity  PT Treatment Interventions DME instruction;Balance training;Gait training;Functional mobility training;Patient/family education;Therapeutic activities;Therapeutic exercise   PT Goals (Current goals can be found in the  Care Plan section) Acute Rehab PT Goals Patient Stated Goal: return home PT Goal Formulation: With patient Time For Goal Achievement: 08/04/14 Potential to Achieve Goals: Good    Frequency Min 3X/week   Barriers to discharge        Co-evaluation               End of Session Equipment Utilized During Treatment: Gait belt Activity Tolerance: Patient tolerated treatment well Patient left: in  chair;with call bell/phone within reach Nurse Communication: Mobility status         Time: [id number] PT Time Calculation (min) (ACUTE ONLY): 19 min   Charges:   PT Evaluation $Initial PT Evaluation Tier I: 1 Procedure PT Treatments $Gait Training: 8-22 mins   PT G Codes:        Bralee Feldt 07/28/2014, 2:05 PM  [employer] CorporationCary Kyliegh Jester PT [phone]

## 2014-07-28 NOTE — Progress Notes (Signed)
Got patient up to bedside commode with 2 person assist. Pt became diaphoretic, nauseous and SOB on bedside commode. BP 135/95. SpO2 96% on [place] 2L. Pt recovered and ambulated to bed. Coughed up large tan sputum.

## 2014-07-28 NOTE — Progress Notes (Signed)
PULMONARY / CRITICAL CARE MEDICINE   Name: Leah French MRN: [id number] DOB: 10/05/1945    ADMISSION DATE:  07/24/2014  REFERRING MD :  [facility]  CHIEF COMPLAINT:  Complete heart block, acute renal failure, hyperkalemia  INITIAL PRESENTATION:  68 y.o. F, [facility] with generalized weakness.  In ED, found to have complete heart block with HR in 20's, acute renal failure, hyperkalemia.  She was paced transcutaneously and [facility] for further evaluation.     STUDIES:  Echo 12/26 >> EF 60 to 65%, grade 1 diastolic dysfx  SIGNIFICANT EVENTS: [facility] with 3rd degree heart block, acute renal failure, hyperkalemia. 12/25 - cardiology sign off 12/27 - transfer to SDU, no bed, improving clinically  SUBJECTIVE:  Cough to vomit x 1  VITAL SIGNS: Temp:  [97.4 F (36.3 C)-98.3 F (36.8 C)] 97.4 F (36.3 C) (12/28 0800) Pulse Rate:  [81-93] 86 (12/28 0800) Resp:  [16-26] 17 (12/28 0800) BP: (124-147)/(50-68) 147/61 mmHg (12/28 0800) SpO2:  [96 %-100 %] 98 % (12/28 0800) Weight:  [95.4 kg (210 lb 5.1 oz)] 95.4 kg (210 lb 5.1 oz) (12/28 0418) INTAKE / OUTPUT: Intake/Output      12/27 0701 - 12/28 0700 12/28 0701 - 12/29 0700   P.O. 720 120   I.V. (mL/kg) 95 (1)    NG/GT     Total Intake(mL/kg) 815 (8.5) 120 (1.3)   Urine (mL/kg/hr) 585 (0.3) 150 (0.6)   Total Output 585 150   Net +230 -30        Emesis Occurrence 1 x      PHYSICAL EXAMINATION: General: no distress Neuro: follows command in chair HEENT: mild stridor Cardiovascular: regular s1 s2  Lungs: CTA Abdomen: soft, non tender Musculoskeletal: 1+ edema reduced Skin: no rashes  LABS:  CBC  Recent Labs Lab 07/26/14 0350 07/27/14 0800 07/28/14 0400  WBC 8.5 5.7 4.8  HGB 10.2* 8.9* 8.4*  HCT 29.6* 28.6* 26.2*  PLT 145* 103* 109*   BMET  Recent Labs Lab 07/26/14 1400 07/27/14 0500 07/28/14 0400  NA 142 145 143  K 3.6 4.1  3.8  CL 107 111 108  CO2 26 27 29   BUN 18 16 18   CREATININE 1.95* 1.79* 1.62*  GLUCOSE 139* 95 85   Electrolytes  Recent Labs Lab 07/24/14 2257 07/25/14 0045  07/26/14 1400 07/27/14 0500 07/28/14 0400  CALCIUM 8.5 9.2  < > 7.2* 7.3* 7.5*  MG 1.9 1.9  --   --  1.6  --   PHOS 6.6* 5.4*  < > 3.1 3.1 2.5  < > = values in this interval not displayed.   Sepsis Markers  Recent Labs Lab 07/24/14 2257  LATICACIDVEN 2.3*   ABG  Recent Labs Lab 07/24/14 2330 07/26/14 0330  PHART 7.142* 7.553*  PCO2ART 40.6 28.9*  PO2ART 405.0* 178.0*   Liver Enzymes  Recent Labs Lab 07/24/14 2257  07/26/14 1400 07/27/14 0500 07/28/14 0400  AST 158*  --   --   --   --   ALT 171*  --   --   --   --   ALKPHOS 40  --   --   --   --   BILITOT 0.5  --   --   --   --   ALBUMIN 2.9*  < > 2.5* 2.3* 2.3*  < > = values in this interval not displayed. Cardiac Enzymes  Recent Labs Lab  07/24/14 2257  TROPONINI 0.13*   Glucose  Recent Labs Lab 07/26/14 2115 07/27/14 0731 07/27/14 1214 07/27/14 1611 07/27/14 2124 07/28/14 0756  GLUCAP 96 102* 99 107* 95 77    Imaging No results found.   ASSESSMENT / PLAN:  PULMONARY ETT 12/24 >>> 12/26 A: Acute hypoxic respiratory failure 2nd to metabolic derangements. Hx COPD, Sarcoid on chronic prednisone. Intermittent stridor resolved P:   Continue brovana, pulmicort with prn albuterol for now Hold outpt ICS/LABA inhaler ?dulera or advair for now with upper airway Resume prednisone 12/27, will keep this today at current dose Will ask speech therapy to assess for stridor/ VCD  CARDIOVASCULAR Rt IJ CVL 12/24 >>> 12/27 Rt radial A line 12/24 >>> 12/27 Lt [place] HD cath 12/25 >>> 12/27  A:  Brief asystolic arrest, complete heart block, cardiogenic shock 2nd to acidosis/hyperkalemia - resolved. Hx CAD, HTN, HLD, MI, valvular heart disease. P:  Continue zocor, ASA Dc home acei, no restart planned Tele  RENAL A:   Acute renal  failure with metabolic acidosis/hyperkalemia >> much improved. Hypokalemia developed 12/26 >> improved 12/27. P:   Monitor renal fx, in am further Resolving well bmet in am   GASTROINTESTINAL A:   GERD. Nutrition. vomiting P:   SUP: Pantoprazole. Carb modified diet kub  HEMATOLOGIC A:   Mild anemia. Thrombocytopenia improved P:  F/u CBC intermittently SQ heparin for DVT prevention, dc as ambulating  INFECTIOUS A:   No indication for infection. P:   Monitor clinically.  ENDOCRINE A:   DM type 2. Relative adrenal insufficiency >> on chronic prednisone as outpt. P:   SSI pred  NEUROLOGIC A:   Acute metabolic encephalopathy >> much improved. Anxiety/depression. Deconditioning. P:   Monitor mental status PT Celexa  Updated pt's family at bedside 12/29  Transfer to tele  ,Mcarthur RossettiDaniel J. Tyson AliasFeinstein, MD, FACP Pgr: [phone] [facility]   Mcarthur Rossettianiel J. Tyson AliasFeinstein, MD, FACP Pgr: [phone] [facility]

## 2014-07-29 DIAGNOSIS — M7989 Other specified soft tissue disorders: Secondary | ICD-10-CM

## 2014-07-29 LAB — BASIC METABOLIC PANEL
ANION GAP: 5 (ref 5–15)
BUN: 16 mg/dL (ref 6–23)
CALCIUM: 7.8 mg/dL — AB (ref 8.4–10.5)
CO2: 27 mmol/L (ref 19–32)
CREATININE: 1.49 mg/dL — AB (ref 0.50–1.10)
Chloride: 109 mEq/L (ref 96–112)
GFR calc non Af Amer: 35 mL/min — ABNORMAL LOW (ref >90)
GFR, EST AFRICAN AMERICAN: 40 mL/min — AB (ref >90)
Glucose, Bld: 80 mg/dL (ref 70–99)
Potassium: 3.4 mmol/L — ABNORMAL LOW (ref 3.5–5.1)
Sodium: 141 mmol/L (ref 135–145)

## 2014-07-29 LAB — PROTEIN ELECTROPHORESIS, SERUM
ALPHA-1-GLOBULIN: 5.5 % — AB (ref 2.9–4.9)
Albumin ELP: 62.7 % (ref 55.8–66.1)
Alpha-2-Globulin: 11.8 % (ref 7.1–11.8)
BETA 2: 3.2 % (ref 3.2–6.5)
BETA GLOBULIN: 6.3 % (ref 4.7–7.2)
Gamma Globulin: 10.5 % — ABNORMAL LOW (ref 11.1–18.8)
M-Spike, %: 0.18 g/dL
Total Protein ELP: 5 g/dL — ABNORMAL LOW (ref 6.0–8.3)

## 2014-07-29 LAB — GLUCOSE, CAPILLARY
GLUCOSE-CAPILLARY: 79 mg/dL (ref 70–99)
Glucose-Capillary: 91 mg/dL (ref 70–99)

## 2014-07-29 MED ORDER — POTASSIUM CHLORIDE CRYS ER 20 MEQ PO TBCR: 20.0000 meq | EXTENDED_RELEASE_TABLET | Freq: Every day | ORAL | Status: DC

## 2014-07-29 NOTE — Evaluation (Signed)
Speech Language Pathology Evaluation Patient Details Name: Batul M Poma MRN: [id number] DOB: 10/05/1945 Today's Date: 07/29/2014 Time: 1050-1110 SLP Time Calculation (min) (ACUTE ONLY): Laretta Bolster20 min  Problem List:  Patient Active Problem List   Diagnosis Date Noted  . Acute respiratory failure   . Acute respiratory failure with hypoxia   . AKI (acute kidney injury)   . Encounter for central line placement   . Respiratory failure   . Acute renal failure syndrome   . Complete heart block 07/24/2014   Past Medical History:  Past Medical History  Diagnosis Date  . Depression   . Anxiety   . Chronic kidney disease   . Myocardial infarction   . Anginal pain   . Dysrhythmia   . Hypertension   . Shortness of breath dyspnea   . Diabetes mellitus without complication   . GERD (gastroesophageal reflux disease)   . Headache    Past Surgical History:  Past Surgical History  Procedure Laterality Date  . Cholecystectomy     HPI:    68 y.o. F, [facility] with generalized weakness.  In ED, found to have complete heart block with HR in 20's, acute renal failure, hyperkalemia.  She was paced transcutaneously and [facility] for [facility].  MD reports intermittent stridor, SLP asked to evaluate pt for vocal cord dysfunction. Pt interview reveals reports of "wheezing." Pt states this can last for several days when her "mucous gets bad." She admits to mild GER, sensitivity to smells, smokers in the home and sensitivity to hot/cold air. She denies any significant anxiety and cannot identify other particular triggers to wheezing events.    Assessment / Plan / Recommendation Clinical Impression  At time of assessment pts vocal quality and respiratory function appear WNL. Given history and report of intermittent stridor, paradoxical vocal fold disorder is a possibility, but can only be diagnosed with the aid of direct laryngoscopy, performed by ENT. SLP discussed  possibilities and helped pt identify possible triggers (activity, cleaning agents/chemicals, smoke, and cold air). SLP provided brief practice in relaxation techniques to open airway. Pt able to return demonstrate. Provided written instruction. Given that symptoms are not directly convincing and pt has multiple co morbidities, would not recommend f/u with ENT or outpatient SLP unless PVCD is high on the differential per pulmonology. No f/u needed, will sign off.     SLP Assessment  All further Speech Lanaguage Pathology  needs can be addressed in the next venue of care    Follow Up Recommendations  Outpatient SLP (if recommended by MD)    Frequency and Duration        Pertinent Vitals/Pain Pain Assessment: No/denies pain   SLP Goals     SLP Evaluation Prior Functioning  Cognitive/Linguistic Baseline: Within functional limits   Cognition  Overall Cognitive Status: Within Functional Limits for tasks assessed    Comprehension  Auditory Comprehension Overall Auditory Comprehension: Appears within functional limits for tasks assessed    Expression Verbal Expression Overall Verbal Expression: Appears within functional limits for tasks assessed   Oral / Motor Oral Motor/Sensory Function Overall Oral Motor/Sensory Function: Appears within functional limits for tasks assessed Motor Speech Phonation: Normal   GO    Harlon DittyBonnie Durk Carmen, MA CCC-SLP [phone]  Claudine MoutonDeBlois, Mariaclara Spear Caroline 07/29/2014, 11:36 AM

## 2014-07-29 NOTE — Progress Notes (Signed)
Left upper extremity venous duplex completed:  No evidence of DVT.  There appears to be superficial thrombosis in the left basilic, antecubital communicating, and cephalic veins.

## 2014-07-29 NOTE — Progress Notes (Signed)
    INITIAL PRESENTATION: 68 y.o. F, [facility] with generalized weakness. In ED, found to have complete heart block with HR in 20's, acute renal failure, hyperkalemia. She was paced transcutaneously and [facility].   Subjective No distress wants to go home   Objective BP 148/98 mmHg  Pulse 97  Temp(Src) 98 F (36.7 C) (Oral)  Resp 17  Ht 4\' 11"  (1.499 m)  Wt 93.26 kg (205 lb 9.6 oz)  BMI 41.50 kg/m2  SpO2 95%  Intake/Output Summary (Last 24 hours) at 07/29/14 1518 Last data filed at 07/28/14 2200  Gross per 24 hour  Intake    240 ml  Output    350 ml  Net   -110 ml   General: no distress Neuro: awake, oriented w/out focal def  HEENT: no stridor  Cardiovascular: regular s1 s2  Lungs: CTA Abdomen: soft, non tender Musculoskeletal: 1+ edema reduced; LUE swelling > right  Skin: no rashes   Recent Labs Lab 07/27/14 0500 07/28/14 0400 07/29/14 0726  NA 145 143 141  K 4.1 3.8 3.4*  CL 111 108 109  CO2 27 29 27   BUN 16 18 16   CREATININE 1.79* 1.62* 1.49*  GLUCOSE 95 85 80    Recent Labs Lab 07/26/14 0350 07/27/14 0800 07/28/14 0400  HGB 10.2* 8.9* 8.4*  HCT 29.6* 28.6* 26.2*  WBC 8.5 5.7 4.8  PLT 145* 103* 109*     Acute on chronic renal failure  (baseline scr not on record, but pt had referral to Nephrology for out-pt work-up) Plan/ recs 1) Resume lasix at home dose  2) NO MORE ACE-I 3) Needs f/u with PCP, this has been arranged for 12/5. She MUST Have a repeat chemistry on this visit to follow up Potassium and renal function  4) keep referral to nephrology  Hypokalemia  Plan Have resumed her potassium at once daily dosing instead of BID given resolving renal injury and h/o life threatening hyperkalemia   HTN, h/o CAD Plan/rec 1) Have resumed her Metoprolol and ASA 2) DO NOT RESUME ACE-I 3) Her family will record her BP at home. May need additional antihypertensive medication depending  on her BP. Will defer further titration to her PC team.   Physical deconditioning Plan Home health PT   COPD Plan/rec Resume home meds   H/o sarcoidosis Plan/rec Resume home prednisone  Thrombocytopenia (stable) Plan/rec F/u out-pt CBC  GERD Plan/rec Resume home PPI  DM II Plan Resume dietary restrictions  Left upper extremity swelling  Plan:  Stat US, prior to dc  Dispo Ready for d/c. Awaiting US results for LUE swelling.   Simonne MartinetPeter E Emri Sample ACNP-BC [facility] Pager # [phone] OR # [phone] if no answer

## 2014-07-29 NOTE — Progress Notes (Signed)
Patient received discharge orders.  Discharge instructions reviewed with patient and patient's daughter.  Patient removed from tele monitor after central telemetry called.  Patient to be taken out by nurse tech to daughter's vehicle.  Safety measures maintained.    Keitha ButteSMITH, Voshon Petro A, RN

## 2014-07-29 NOTE — Discharge Summary (Signed)
Physician Discharge Summary       Patient ID: Leah French MRN: [id number] DOB/AGE: [birth year] 68 y.o.  Admit date: 07/24/2014 Discharge date: 07/29/2014  Discharge Diagnoses:  Acute hypoxic respiratory failure (resolved) COPD Sarcoidosis  Intermittent stridor (resolved) Complete heart block (resolved) Brief Asystolic arrest.  Cardiogenic shock (resolved) Acute on chronic renal failure  Hyperkalemia (resolved) Metabolic acidosis (resolved) Nausea/vomiting (resolved) Acute Encephalopathy (resolved) Physical Deconditioning  H/o anxiety and depression  Chronic anemia  Thrombocytopenia  Relative Adrenal Insufficiency  Diabetes type II H/o CAD H/o HTN  Valvular heart disease  GERD  Detailed Hospital Course:   68 y.o. F with PMH of CAD, HTN, MI, HLD, valvular heart disease, COPD, Sarcoid, GERD, Arthritis, Depression, Anxiety, DM.[facility] with generalized weakness. In ED, she was noted to be in complete heart block with HR in low 20's. In addition, she had acute renal failure (SCr 5.50), and Hyperkalemia (7.1). She was given atropine without much change; therefore, was started on transcutaneous pacing and started on isoproterenol. For her potassium, she received albuterol, calcium, insulin, D50. [facility] for further evaluation and management. After arrival in our intensive care she had a brief <10 second loss of pulse. ROSC returned in seconds.She was intubated for airway protection, emergent dialysis catheter was placed and nephrology was consulted. She was initiated on emergent hemodialysis with working diagnosis if AKI in the setting of dehydration and ACE-I, resulting in Life threatening Hyperkalemia and metabolic acidosis. These issues were resolved after dialysis. In addition to her ace-I being held, also her diuretic and her beta-blocker was held due to the renal failure and bradycardia. The bradycardia resolved with  corrrection of her hyperkalemia. Her renal function continued to normalize. She was extubated on 12/26, and transferred to the medical ward on 12/28 after further monitoring to ensure normalization of renal function and no further cardiac or electrolyte abnormalities. On 12/29 she was deemed stable for discharge. On discharge exam she was found to have significant left upper extremity swelling. Her daughter stated that she had an IV infiltrate there 3 days prior, but she still had significant swelling.  She had her HD cath on this side so a left upper extremity ultrasound was obtained to assess this. The results were as follows:No evidence of DVT. "There appears to be superficial thrombosis in the left basilic, antecubital communicating, and cephalic veins" She was deemed ready for discharge as of 12/29 with the following plan of care per her active problem list.     Discharge Plan by active problems  Acute on chronic renal failure  (baseline scr not on record, but pt had referral to Nephrology for out-pt work-up) Plan/ recs 1) Resume lasix at home dose  2) NO MORE ACE-I 3) Needs f/u with PCP, this has been arranged for 12/5. She MUST Have a repeat chemistry on this visit to follow up Potassium and renal function  4) keep referral to nephrology  Hypokalemia  Plan Have resumed her potassium at once daily dosing instead of BID given resolving renal injury and h/o life threatening hyperkalemia   HTN, h/o CAD Plan/rec 1) Have resumed her Metoprolol and ASA 2) DO NOT RESUME ACE-I 3) Her family will record her BP at home. May need additional antihypertensive medication depending on her BP. Will defer further titration to her PC team.   Physical deconditioning Plan Home health PT   COPD Plan/rec Resume home meds   H/o sarcoidosis Plan/rec Resume home prednisone  Thrombocytopenia (stable) Plan/rec F/u out-pt CBC  GERD Plan/rec Resume home PPI  DM II Plan Resume dietary  restrictions  Left upper extremity  superficial thrombosis in the left basilic, antecubital communicating, and cephalic veins Plan:  Warm compresses and elevation F/u with PCP, if continues may consider repeat US and possible anticoagulation    Significant Hospital tests/ studies  Consults: nephrology and Cardiology  12/26 ECHO: EF 60-65%; nml wall motion, grade I diastolic dysfunction    Rt IJ CVL 12/24 >>> 12/27 Rt radial A line 12/24 >>> 12/27 Lt [place] HD cath 12/25 >>> 12/27   Discharge Exam: BP 148/98 mmHg  Pulse 97  Temp(Src) 98 F (36.7 C) (Oral)  Resp 17  Ht 4\' 11"  (1.499 m)  Wt 93.26 kg (205 lb 9.6 oz)  BMI 41.50 kg/m2  SpO2 95%  General: no distress Neuro: awake, oriented w/out focal def  HEENT: no stridor  Cardiovascular: regular s1 s2  Lungs: CTA Abdomen: soft, non tender Musculoskeletal: 1+ edema reduced; LUE swelling > right  Skin: no rashes  Labs at discharge Lab Results  Component Value Date   CREATININE 1.49* 07/29/2014   BUN 16 07/29/2014   NA 141 07/29/2014   K 3.4* 07/29/2014   CL 109 07/29/2014   CO2 27 07/29/2014   Lab Results  Component Value Date   WBC 4.8 07/28/2014   HGB 8.4* 07/28/2014   HCT 26.2* 07/28/2014   MCV 91.6 07/28/2014   PLT 109* 07/28/2014   Lab Results  Component Value Date   ALT 171* 07/24/2014   AST 158* 07/24/2014   ALKPHOS 40 07/24/2014   BILITOT 0.5 07/24/2014   No results found for: INR, PROTIME  Current radiology studies Dg Abd Portable 1v  07/28/2014   CLINICAL DATA:  Vomiting which began yesterday. Current history of sarcoidosis. Current history of right lower lobe lung mass, pathology unknown.  EXAM: PORTABLE ABDOMEN - 1 VIEW  COMPARISON:  PET-CT 05/09/2014.  FINDINGS: Bowel gas pattern unremarkable without evidence of obstruction or significant ileus. Expected stool burden in the colon. Surgical clips in the right upper quadrant from prior cholecystectomy. No visible opaque urinary tract calculi.  Degenerative changes involving the visualized lower thoracic and lumbar spine an the right hip.  IMPRESSION: No acute abdominal abnormality.   Electronically Signed   By: Hulan Saas M.D.   On: 07/28/2014 10:54    Disposition:  Final discharge disposition not confirmed     Medication List    STOP taking these medications        lisinopril 20 MG tablet  Commonly known as:  PRINIVIL,ZESTRIL     Vitamin D (Ergocalciferol) 50000 UNITS Caps capsule  Commonly known as:  DRISDOL      TAKE these medications        ASPIRIN LOW DOSE 81 MG EC tablet  Generic drug:  aspirin  Take 81 mg by mouth daily.     citalopram 20 MG tablet  Commonly known as:  CELEXA  Take 20 mg by mouth daily.     Fluticasone-Salmeterol 250-50 MCG/DOSE Aepb  Commonly known as:  ADVAIR  Inhale 1 puff into the lungs 2 (two) times daily.     furosemide 40 MG tablet  Commonly known as:  LASIX  Take 40 mg by mouth daily.     ipratropium-albuterol 0.5-2.5 (3) MG/3ML Soln  Commonly known as:  DUONEB  Inhale 3 mLs into the lungs every 6 (six) hours as needed (wheezing).     methocarbamol 750 MG  tablet  Commonly known as:  ROBAXIN  Take 750 mg by mouth every 8 (eight) hours as needed for muscle spasms.     metoprolol succinate 50 MG 24 hr tablet  Commonly known as:  TOPROL-XL  Take 50 mg by mouth daily.     OMEGA-3 FISH OIL PO  Take 600 mg by mouth daily.     omeprazole 20 MG capsule  Commonly known as:  PRILOSEC  Take 20 mg by mouth daily.     potassium chloride SA 20 MEQ tablet  Commonly known as:  K-DUR,KLOR-CON  Take 20 mEq by mouth 2 (two) times daily.     predniSONE 10 MG tablet  Commonly known as:  DELTASONE  Take 10 mg by mouth daily.     RELIEF EYE DROPS OP  Apply 1 drop to eye daily. Right eye     simvastatin 40 MG tablet  Commonly known as:  ZOCOR  Take 40 mg by mouth at bedtime.       Follow-up Information    Follow up with Tacey RuizLLESCHEL, EMILY, FNP On 08/05/2014.   Specialty:   Family Medicine   Why:  [id number]. [id number]   Contact information:   [address] [place] [id number] [phone]       Discharged Condition: good   Signed: BABCOCK,PETE 07/29/2014, 2:48 PM  Physician Statement:   The Patient was personally examined, the discharge assessment and plan has been personally reviewed and I agree with ACNP Babcock's assessment and plan. > 30 minutes of time have been dedicated to discharge assessment, planning and discharge instructions.   AKI has improved, stop lisinopril LUE duplex shows superficial thrombosis - does not need anticoag Resume prednisone for sarcoid, needs FU BMET for K check  Oretha MilchALVA,RAKESH V. MD

## 2014-07-31 LAB — COMPLEMENT, TOTAL: COMPL TOTAL (CH50): 58 U/mL (ref 31–60)

## 2014-08-01 DIAGNOSIS — Z95810 Presence of automatic (implantable) cardiac defibrillator: Secondary | ICD-10-CM

## 2014-08-01 DIAGNOSIS — I442 Atrioventricular block, complete: Secondary | ICD-10-CM

## 2014-08-01 HISTORY — DX: Presence of automatic (implantable) cardiac defibrillator: Z95.810

## 2014-08-01 HISTORY — DX: Atrioventricular block, complete: I44.2

## 2015-11-14 IMAGING — CR DG CHEST 1V PORT
1 series · 1 of 1 positions shown · non-contrast
Comparison: 07/24/2014 at [DATE] p.m.

CLINICAL DATA: Tube and line adjustment, central line placement

EXAM:
PORTABLE CHEST - 1 VIEW

[AP]
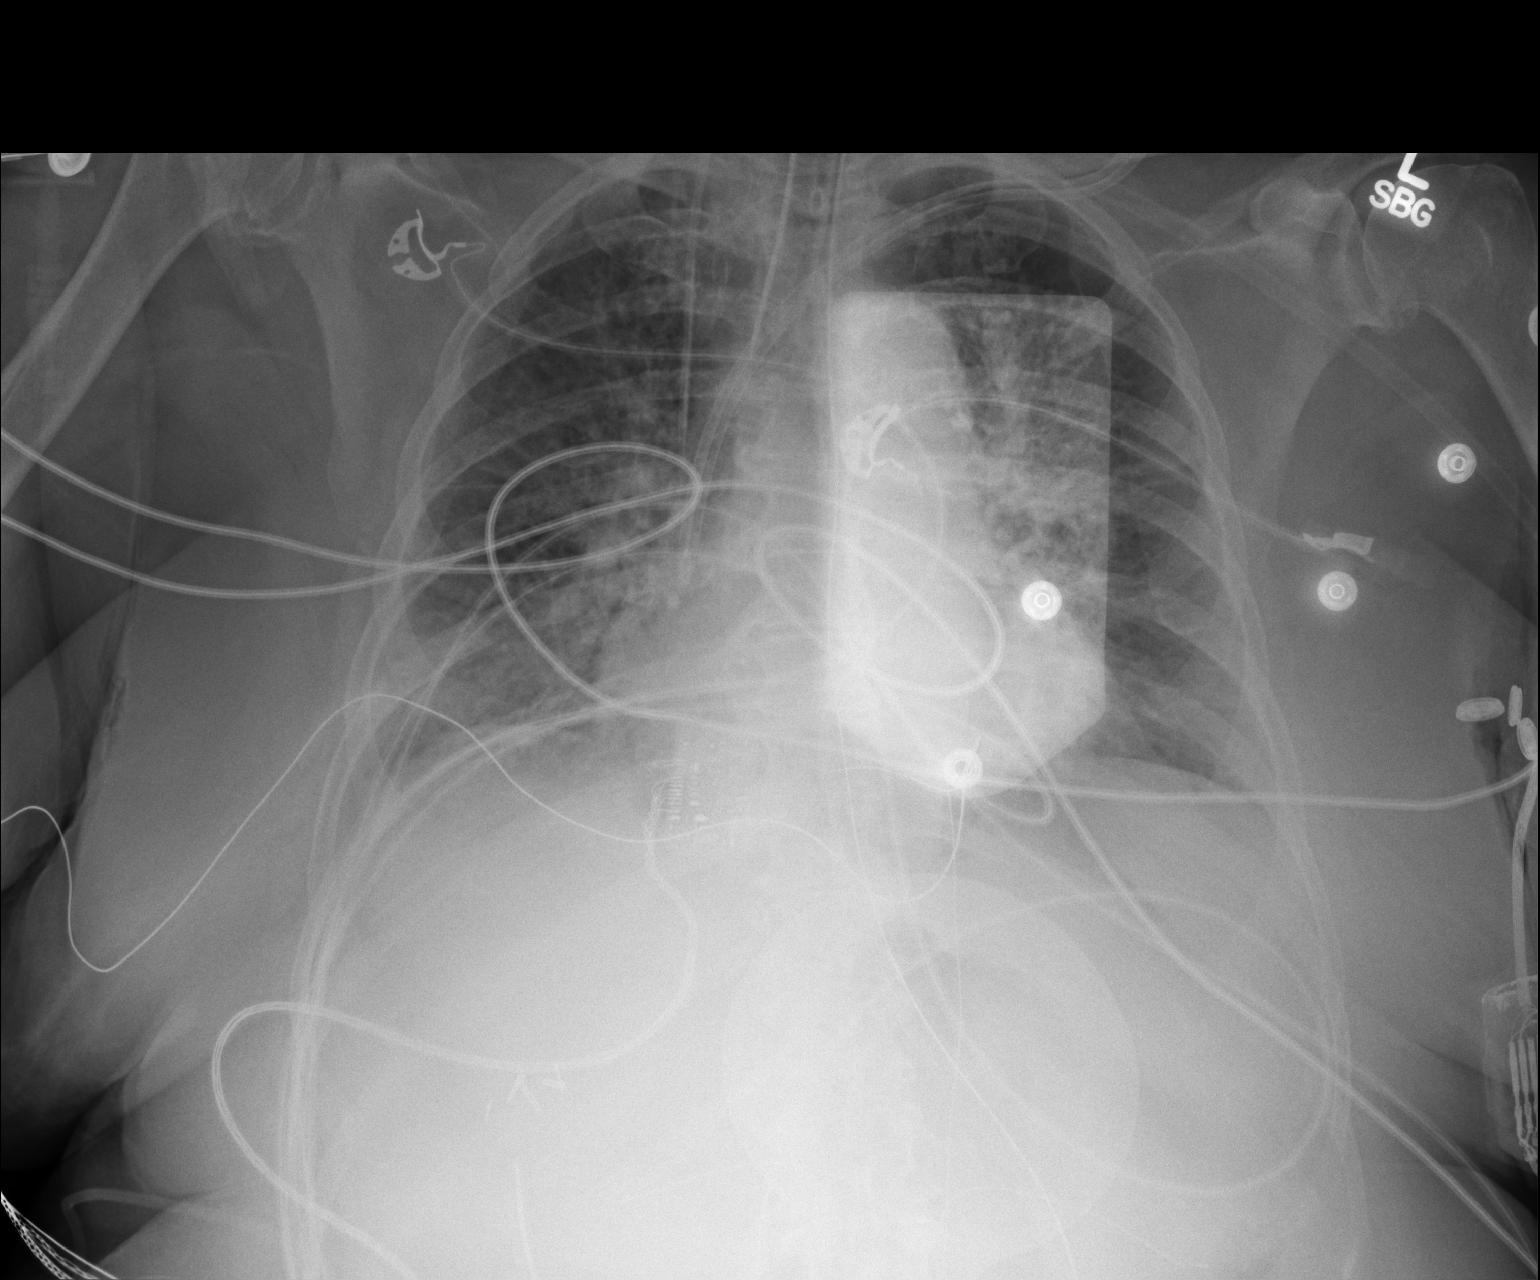

[1 of 1 positions shown; findings below may reference images not displayed]

FINDINGS: Endotracheal tube now terminates 1.6 cm above the carina.
Nasogastric tube tip terminates below the level of the
hemidiaphragms but is not included in the field of view. Left
subclavian approach dual lumen catheter tips terminate at the
cavoatrial junction. New right IJ approach central line tip
terminates over the distal SVC. No pneumothorax. No other change.
IMPRESSION: Tube and line placement as above.

## 2015-11-14 IMAGING — CR DG CHEST 1V PORT
1 series · 1 of 1 positions shown · non-contrast
Comparison: 07/25/2014 at [DATE] a.m.

CLINICAL DATA: Endotracheal tube placement. Acute respiratory
failure.

EXAM:
PORTABLE CHEST - 1 VIEW

[AP]
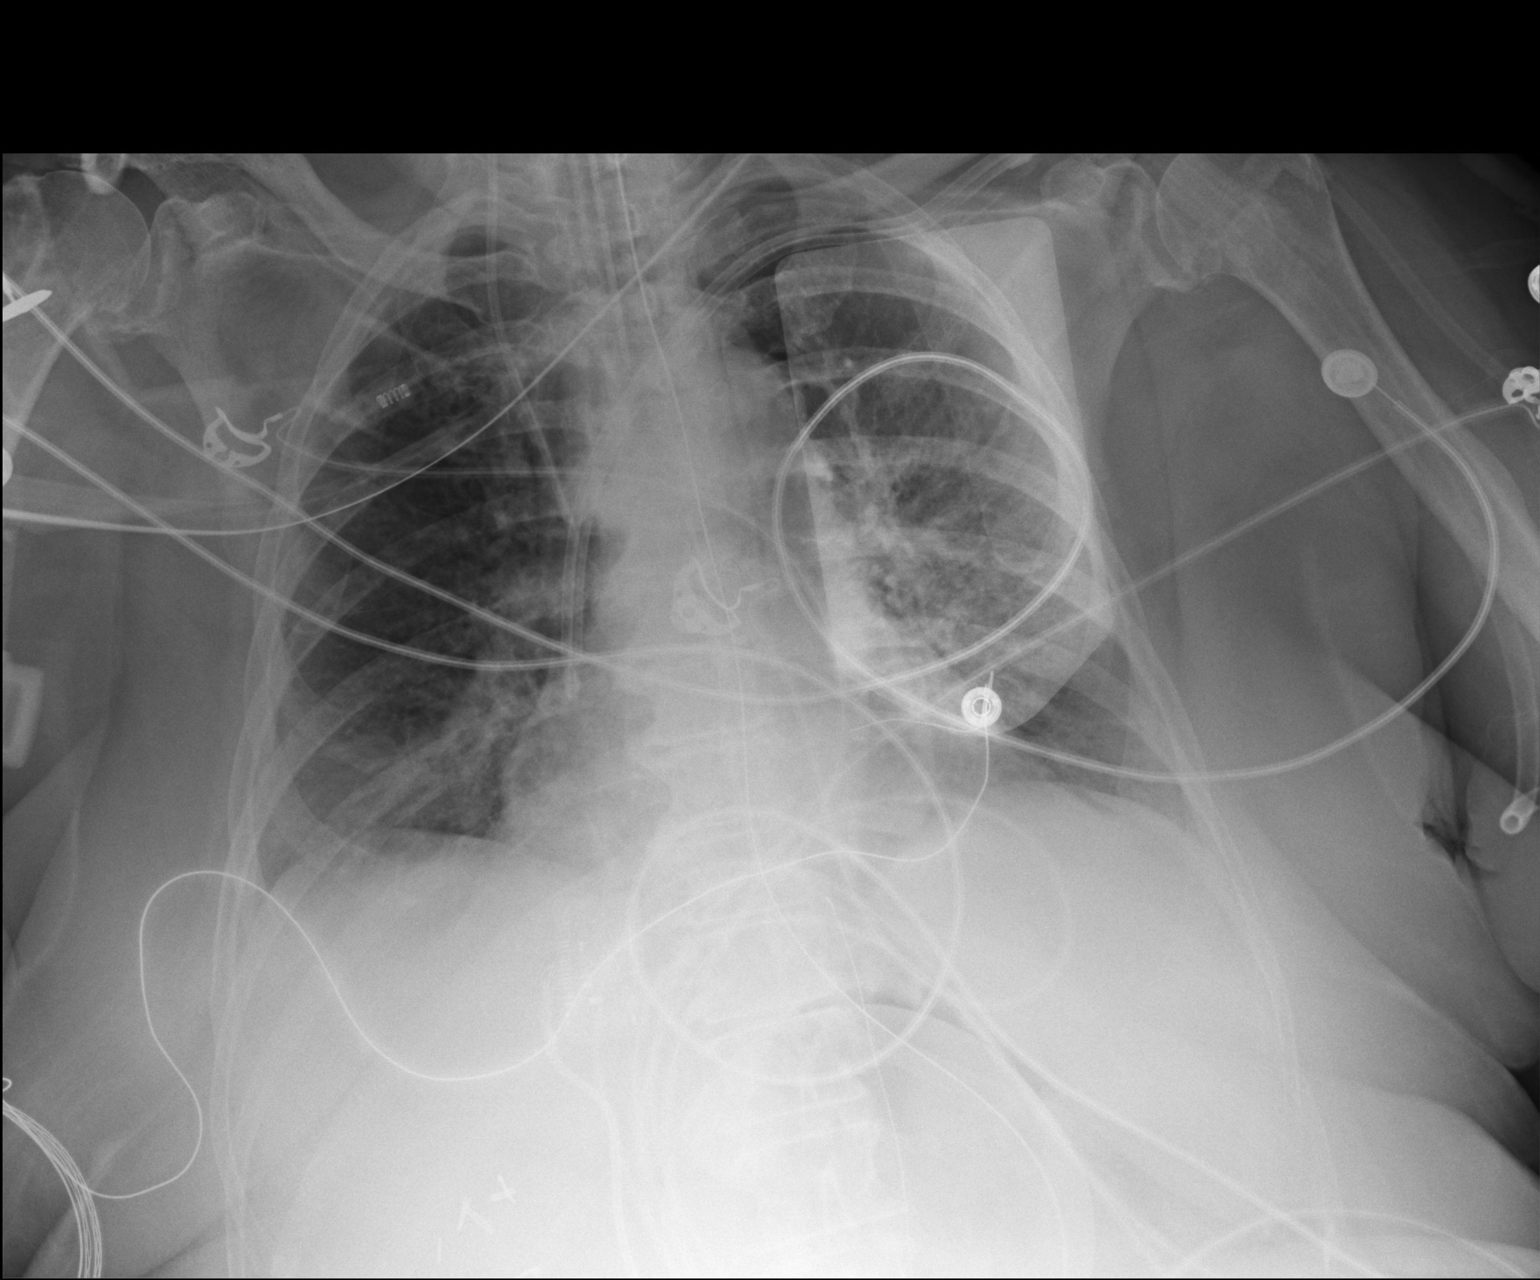

[1 of 1 positions shown; findings below may reference images not displayed]

FINDINGS: Endotracheal tube tip 3.5 cm above the carina. Right IJ line tip:
SVC. Left subclavian line tip: Cavoatrial junction. Nasogastric tube
enters the stomach.

No pneumothorax. Borderline cardiomegaly. Perihilar interstitial
accentuation mildly improved compared to the earlier exam.
IMPRESSION: 1. Improved perihilar interstitial accentuation compared to the
earlier exam. Tubes and lines appear satisfactorily positioned.

## 2017-02-11 ENCOUNTER — Encounter (HOSPITAL_COMMUNITY): Payer: Self-pay | Admitting: *Deleted

## 2017-02-11 ENCOUNTER — Inpatient Hospital Stay (HOSPITAL_COMMUNITY)
Admission: EM | Admit: 2017-02-11 | Discharge: 2017-02-15 | DRG: 226 | Disposition: A | Discharge status: Home / Self Care | Payer: Medicare Other | Source: Other Acute Inpatient Hospital | Attending: Internal Medicine | Admitting: Internal Medicine

## 2017-02-11 ENCOUNTER — Encounter (HOSPITAL_COMMUNITY)
Admission: EM | Disposition: A | Discharge status: Home / Self Care | Payer: Self-pay | Source: Other Acute Inpatient Hospital | Attending: Internal Medicine

## 2017-02-11 DIAGNOSIS — Z7982 Long term (current) use of aspirin: Secondary | ICD-10-CM

## 2017-02-11 DIAGNOSIS — Z888 Allergy status to other drugs, medicaments and biological substances status: Secondary | ICD-10-CM | POA: Diagnosis not present

## 2017-02-11 DIAGNOSIS — I468 Cardiac arrest due to other underlying condition: Secondary | ICD-10-CM | POA: Diagnosis present

## 2017-02-11 DIAGNOSIS — D8685 Sarcoid myocarditis: Secondary | ICD-10-CM

## 2017-02-11 DIAGNOSIS — I442 Atrioventricular block, complete: Secondary | ICD-10-CM | POA: Diagnosis present

## 2017-02-11 DIAGNOSIS — I472 Ventricular tachycardia: Secondary | ICD-10-CM | POA: Diagnosis not present

## 2017-02-11 DIAGNOSIS — I4891 Unspecified atrial fibrillation: Secondary | ICD-10-CM | POA: Diagnosis not present

## 2017-02-11 DIAGNOSIS — T82198A Other mechanical complication of other cardiac electronic device, initial encounter: Secondary | ICD-10-CM | POA: Diagnosis present

## 2017-02-11 DIAGNOSIS — Z9981 Dependence on supplemental oxygen: Secondary | ICD-10-CM

## 2017-02-11 DIAGNOSIS — Z7952 Long term (current) use of systemic steroids: Secondary | ICD-10-CM | POA: Diagnosis not present

## 2017-02-11 DIAGNOSIS — Z79899 Other long term (current) drug therapy: Secondary | ICD-10-CM

## 2017-02-11 DIAGNOSIS — I129 Hypertensive chronic kidney disease with stage 1 through stage 4 chronic kidney disease, or unspecified chronic kidney disease: Secondary | ICD-10-CM | POA: Diagnosis not present

## 2017-02-11 DIAGNOSIS — K219 Gastro-esophageal reflux disease without esophagitis: Secondary | ICD-10-CM | POA: Diagnosis present

## 2017-02-11 DIAGNOSIS — Z6841 Body Mass Index (BMI) 40.0 and over, adult: Secondary | ICD-10-CM

## 2017-02-11 DIAGNOSIS — J9611 Chronic respiratory failure with hypoxia: Secondary | ICD-10-CM | POA: Diagnosis not present

## 2017-02-11 DIAGNOSIS — R0689 Other abnormalities of breathing: Secondary | ICD-10-CM | POA: Diagnosis not present

## 2017-02-11 DIAGNOSIS — D860 Sarcoidosis of lung: Secondary | ICD-10-CM | POA: Diagnosis not present

## 2017-02-11 DIAGNOSIS — Z7951 Long term (current) use of inhaled steroids: Secondary | ICD-10-CM | POA: Diagnosis not present

## 2017-02-11 DIAGNOSIS — I469 Cardiac arrest, cause unspecified: Secondary | ICD-10-CM

## 2017-02-11 DIAGNOSIS — N183 Chronic kidney disease, stage 3 unspecified: Secondary | ICD-10-CM

## 2017-02-11 DIAGNOSIS — N189 Chronic kidney disease, unspecified: Secondary | ICD-10-CM | POA: Diagnosis not present

## 2017-02-11 DIAGNOSIS — E1122 Type 2 diabetes mellitus with diabetic chronic kidney disease: Secondary | ICD-10-CM | POA: Diagnosis not present

## 2017-02-11 DIAGNOSIS — Z959 Presence of cardiac and vascular implant and graft, unspecified: Secondary | ICD-10-CM

## 2017-02-11 DIAGNOSIS — T82198D Other mechanical complication of other cardiac electronic device, subsequent encounter: Secondary | ICD-10-CM | POA: Diagnosis not present

## 2017-02-11 DIAGNOSIS — F0390 Unspecified dementia without behavioral disturbance: Secondary | ICD-10-CM | POA: Diagnosis present

## 2017-02-11 DIAGNOSIS — I272 Pulmonary hypertension, unspecified: Secondary | ICD-10-CM | POA: Diagnosis present

## 2017-02-11 DIAGNOSIS — J449 Chronic obstructive pulmonary disease, unspecified: Secondary | ICD-10-CM | POA: Diagnosis not present

## 2017-02-11 DIAGNOSIS — I252 Old myocardial infarction: Secondary | ICD-10-CM

## 2017-02-11 DIAGNOSIS — T82110A Breakdown (mechanical) of cardiac electrode, initial encounter: Principal | ICD-10-CM | POA: Diagnosis present

## 2017-02-11 DIAGNOSIS — I360 Nonrheumatic tricuspid (valve) stenosis: Secondary | ICD-10-CM | POA: Diagnosis not present

## 2017-02-11 DIAGNOSIS — T82118A Breakdown (mechanical) of other cardiac electronic device, initial encounter: Secondary | ICD-10-CM | POA: Diagnosis present

## 2017-02-11 HISTORY — DX: Dependence on other enabling machines and devices: Z99.89

## 2017-02-11 HISTORY — DX: Sarcoidosis of lung: D86.0

## 2017-02-11 HISTORY — PX: TEMPORARY PACEMAKER: CATH118268

## 2017-02-11 HISTORY — DX: Obstructive sleep apnea (adult) (pediatric): G47.33

## 2017-02-11 HISTORY — DX: Presence of automatic (implantable) cardiac defibrillator: Z95.810

## 2017-02-11 HISTORY — DX: Atrioventricular block, complete: I44.2

## 2017-02-11 LAB — MRSA PCR SCREENING: MRSA by PCR: NEGATIVE

## 2017-02-11 LAB — GLUCOSE, CAPILLARY
GLUCOSE-CAPILLARY: 95 mg/dL (ref 65–99)
Glucose-Capillary: 89 mg/dL (ref 65–99)
Glucose-Capillary: 133 mg/dL — ABNORMAL HIGH (ref 65–99)

## 2017-02-11 SURGERY — TEMPORARY PACEMAKER
Anesthesia: LOCAL

## 2017-02-11 MED ORDER — DOPAMINE-DEXTROSE 3.2-5 MG/ML-% IV SOLN: 5.0000 ug/kg/min | INTRAVENOUS | Status: DC

## 2017-02-11 MED ORDER — PREDNISONE 10 MG PO TABS
10.0000 mg | ORAL_TABLET | Freq: Every day | ORAL | Status: AC
  Administered 2017-02-12 – 2017-02-14 (×3): 10 mg via ORAL
  Filled 2017-02-11 (×3): qty 1

## 2017-02-11 MED ORDER — HEPARIN (PORCINE) IN NACL 2-0.9 UNIT/ML-% IJ SOLN
INTRAMUSCULAR | Status: DC | PRN
  Administered 2017-02-11: 500 mL

## 2017-02-11 MED ORDER — ENOXAPARIN SODIUM 40 MG/0.4ML ~~LOC~~ SOLN
40.0000 mg | SUBCUTANEOUS | Status: DC
  Administered 2017-02-11 – 2017-02-12 (×2): 40 mg via SUBCUTANEOUS
  Filled 2017-02-11 (×2): qty 0.4

## 2017-02-11 MED ORDER — PANTOPRAZOLE SODIUM 40 MG PO TBEC
40.0000 mg | DELAYED_RELEASE_TABLET | Freq: Every day | ORAL | Status: DC
  Administered 2017-02-11 – 2017-02-15 (×5): 40 mg via ORAL
  Filled 2017-02-11 (×5): qty 1

## 2017-02-11 MED ORDER — MOMETASONE FURO-FORMOTEROL FUM 200-5 MCG/ACT IN AERO
2.0000 | INHALATION_SPRAY | Freq: Two times a day (BID) | RESPIRATORY_TRACT | Status: DC
  Administered 2017-02-11 – 2017-02-14 (×6): 2 via RESPIRATORY_TRACT
  Filled 2017-02-11 (×2): qty 8.8

## 2017-02-11 MED ORDER — TIZANIDINE HCL 4 MG PO TABS
2.0000 mg | ORAL_TABLET | Freq: Three times a day (TID) | ORAL | Status: DC
  Administered 2017-02-11 – 2017-02-15 (×11): 2 mg via ORAL
  Filled 2017-02-11 (×13): qty 1

## 2017-02-11 MED ORDER — LIDOCAINE HCL (PF) 1 % IJ SOLN
INTRAMUSCULAR | Status: DC | PRN
  Administered 2017-02-11: 14 mL

## 2017-02-11 MED ORDER — ISOSORBIDE DINITRATE 10 MG PO TABS
10.0000 mg | ORAL_TABLET | Freq: Three times a day (TID) | ORAL | Status: DC
  Administered 2017-02-11 – 2017-02-14 (×10): 10 mg via ORAL
  Filled 2017-02-11 (×11): qty 1

## 2017-02-11 MED ORDER — TRAZODONE HCL 50 MG PO TABS
25.0000 mg | ORAL_TABLET | Freq: Every day | ORAL | Status: DC
  Administered 2017-02-11 – 2017-02-14 (×4): 25 mg via ORAL
  Filled 2017-02-11 (×4): qty 1

## 2017-02-11 MED ORDER — HYDRALAZINE HCL 10 MG PO TABS
10.0000 mg | ORAL_TABLET | Freq: Three times a day (TID) | ORAL | Status: DC
  Administered 2017-02-11 – 2017-02-15 (×10): 10 mg via ORAL
  Filled 2017-02-11 (×11): qty 1

## 2017-02-11 MED ORDER — HEPARIN (PORCINE) IN NACL 2-0.9 UNIT/ML-% IJ SOLN
INTRAMUSCULAR | Status: AC
  Filled 2017-02-11: qty 500

## 2017-02-11 MED ORDER — MIDAZOLAM HCL 2 MG/2ML IJ SOLN
INTRAMUSCULAR | Status: AC
  Filled 2017-02-11: qty 2

## 2017-02-11 MED ORDER — ATORVASTATIN CALCIUM 10 MG PO TABS
10.0000 mg | ORAL_TABLET | Freq: Every day | ORAL | Status: DC
  Administered 2017-02-11 – 2017-02-14 (×4): 10 mg via ORAL
  Filled 2017-02-11 (×4): qty 1

## 2017-02-11 MED ORDER — IPRATROPIUM-ALBUTEROL 0.5-2.5 (3) MG/3ML IN SOLN
RESPIRATORY_TRACT | Status: AC
  Filled 2017-02-11: qty 3

## 2017-02-11 MED ORDER — FLUCONAZOLE 150 MG PO TABS
150.0000 mg | ORAL_TABLET | Freq: Once | ORAL | Status: AC
  Administered 2017-02-11: 150 mg via ORAL
  Filled 2017-02-11: qty 1

## 2017-02-11 MED ORDER — CITALOPRAM HYDROBROMIDE 20 MG PO TABS
20.0000 mg | ORAL_TABLET | Freq: Every day | ORAL | Status: DC
  Administered 2017-02-11 – 2017-02-15 (×5): 20 mg via ORAL
  Filled 2017-02-11 (×5): qty 1

## 2017-02-11 MED ORDER — SODIUM CHLORIDE 0.9 % IV SOLN
INTRAVENOUS | Status: DC
  Administered 2017-02-11 – 2017-02-12 (×2): via INTRAVENOUS

## 2017-02-11 MED ORDER — ASPIRIN EC 81 MG PO TBEC
81.0000 mg | DELAYED_RELEASE_TABLET | Freq: Every day | ORAL | Status: DC
Start: 2017-02-11 — End: 2017-02-15
  Administered 2017-02-11 – 2017-02-15 (×5): 81 mg via ORAL
  Filled 2017-02-11 (×5): qty 1

## 2017-02-11 MED ORDER — IPRATROPIUM-ALBUTEROL 0.5-2.5 (3) MG/3ML IN SOLN
3.0000 mL | Freq: Four times a day (QID) | RESPIRATORY_TRACT | Status: DC
  Administered 2017-02-11 – 2017-02-12 (×5): 3 mL via RESPIRATORY_TRACT
  Filled 2017-02-11 (×5): qty 3

## 2017-02-11 MED ORDER — SODIUM CHLORIDE 0.9 % IV SOLN
INTRAVENOUS | Status: AC | PRN
  Administered 2017-02-11: 10 mL/h via INTRAVENOUS

## 2017-02-11 MED ORDER — LIDOCAINE HCL (PF) 1 % IJ SOLN
INTRAMUSCULAR | Status: AC
  Filled 2017-02-11: qty 30

## 2017-02-11 MED ORDER — MIDAZOLAM HCL 2 MG/2ML IJ SOLN: 2.0000 mg | Freq: Once | INTRAMUSCULAR | Status: DC | PRN

## 2017-02-11 MED ORDER — MOMETASONE FURO-FORMOTEROL FUM 100-5 MCG/ACT IN AERO: 2.0000 | INHALATION_SPRAY | Freq: Two times a day (BID) | RESPIRATORY_TRACT | Status: DC

## 2017-02-11 SURGICAL SUPPLY — 6 items
CATH S G BIP PACING (SET/KITS/TRAYS/PACK) ×2 IMPLANT
COVER PRB 48X5XTLSCP FOLD TPE (BAG) ×1 IMPLANT
COVER PROBE 5X48 (BAG) ×1
PACK CARDIAC CATHETERIZATION (CUSTOM PROCEDURE TRAY) ×2 IMPLANT
SHEATH PINNACLE 6F 10CM (SHEATH) ×2 IMPLANT
SLEEVE REPOSITIONING LENGTH 30 (MISCELLANEOUS) ×2 IMPLANT

## 2017-02-11 NOTE — Progress Notes (Signed)
CTSP 2/2 asystole 2./2 fracture of ICD lead Initially was pacing and oversensing   Now unable to pace at max output Will plan transvenous pacing for back up   We need to know what LV function is prior to revision as CRT might be most appropriate  BP (!) 144/84   Pulse 79   Resp (!) 25   SpO2 96%  Clear SLOW but reg  HR 20>>35 Sluggish of mind Perfusion <2 sec  

## 2017-02-11 NOTE — Interval H&P Note (Signed)
History and Physical Interval Note:  02/11/2017 3:40 PM  Leah BolsterShirley M Creasman  has presented today for surgery, with the diagnosis of asystole  The various methods of treatment have been discussed with the patient and family. After consideration of risks, benefits and other options for treatment, the patient has consented to  Procedure(s): Temporary Pacemaker (N/A) as a surgical intervention .  The patient's history has been reviewed, patient examined, no change in status, stable for surgery.  I have reviewed the patient's chart and labs.  Questions were answered to the patient's satisfaction.     Theron AristaPeter [facility] BakersfieldJordanMD,FACC

## 2017-02-11 NOTE — Consult Note (Addendum)
ELECTROPHYSIOLOGY CONSULT NOTE  Patient ID: Leah French, MRN: [id number], DOB/AGE: 71-Sep-1947 71 y.o. Admit date: 02/11/2017 Date of Consult: 02/11/2017  Primary Physician: No primary care provider on file. Primary Cardiologist: [facility]  Chief Complaint: Inappropriate IOCD shocks   HPI BRISA AUTH is a 71 y.o. female  Referred for multiple inappropriate ICD discharges  She has a history of pulmonary sarcoid. She is on prednisone for this. She also has a history of acute renal failure complicated by hyperkalemia associated with complete heart block and syncope 12/15. She ended up with recurrent syncope and normal electrolytes and after recurrent syncope ultimately  underwent device implantation at [facility] for recurrent heart block (presumed because of the description of a temporary pacing catheter). She received an ICD, indication of which is not clear but I am presuming that it was because she had known sarcoid-extracardiac   Today while on the commode she was shocked. She ultimately received 43 shocks.  Electrolytes were normal.  She is short of breath at baseline. She is able to walk less than about 30 feet. She denies exertional chest discomfort.  This is ascribed to her pulmonary sarcoid. She had peripheral edema.  She is on oxygen therapy. LV function is not known. Echocardiogram 12/15 EF was normal   Creatinine today was 1.20  Device interrogation demonstrates high-frequency noise.  She is device dependent. She is programmed D00     Past Medical History:  Diagnosis Date  . Anginal pain   . Anxiety   . Chronic kidney disease   . Depression   . Diabetes mellitus without complication   . Dysrhythmia   . GERD (gastroesophageal reflux disease)   . Headache   . Hypertension   . Myocardial infarction (HCC)   . Shortness of breath dyspnea       Surgical History:  Past Surgical History:  Procedure Laterality Date  . CHOLECYSTECTOMY       Home Meds: Prior to Admission  medications   Medication Sig Start Date End Date Taking? Authorizing Provider  ASPIRIN LOW DOSE 81 MG EC tablet Take 81 mg by mouth daily. 07/05/14   [provider]  citalopram (CELEXA) 20 MG tablet Take 20 mg by mouth daily. 07/18/14   [provider]  Fluticasone-Salmeterol (ADVAIR) 250-50 MCG/DOSE AEPB Inhale 1 puff into the lungs 2 (two) times daily.    [provider]  furosemide (LASIX) 40 MG tablet Take 40 mg by mouth daily. 06/10/14   [provider]  ipratropium-albuterol (DUONEB) 0.5-2.5 (3) MG/3ML SOLN Inhale 3 mLs into the lungs every 6 (six) hours as needed (wheezing).  05/22/14   [provider]  methocarbamol (ROBAXIN) 750 MG tablet Take 750 mg by mouth every 8 (eight) hours as needed for muscle spasms.    [provider]  metoprolol succinate (TOPROL-XL) 50 MG 24 hr tablet Take 50 mg by mouth daily. 06/21/14   [provider]  Omega-3 Fatty Acids (OMEGA-3 FISH OIL PO) Take 600 mg by mouth daily.    [provider]  omeprazole (PRILOSEC) 20 MG capsule Take 20 mg by mouth daily. 06/21/14   [provider]  potassium chloride SA (K-DUR,KLOR-CON) 20 MEQ tablet Take 1 tablet (20 mEq total) by mouth daily. 07/29/14   Simonne Martinet, NP  predniSONE (DELTASONE) 10 MG tablet Take 10 mg by mouth daily. 07/18/14   [provider]  simvastatin (ZOCOR) 40 MG tablet Take 40 mg by mouth at bedtime.  06/02/14  [provider]  Tetrahydrozoline-Zn Sulfate (RELIEF EYE DROPS OP) Apply 1 drop to eye daily. Right eye    [provider]     Allergies: No Known Allergies  Social History   Social History  . Marital status: Widowed    Spouse name: N/A  . Number of children: N/A  . Years of education: N/A   Occupational History  . Not on file.   Social History Main Topics  . Smoking status: Never Smoker  . Smokeless tobacco: Not on file  . Alcohol use No  . Drug use: No  . Sexual  activity: No   Other Topics Concern  . Not on file   Social History Narrative  . No narrative on file     No family history on file.   ROS:  Please see the history of present illness.     All other systems reviewed and negative.    Physical Exam:  Blood pressure 107/77, pulse 80, resp. rate 14, SpO2 100 %. General: Well developed, well nourished female in no acute distress. Head: Normocephalic, atraumatic, sclera non-icteric, no xanthomas, nares are without discharge. EENT: normal Lymph Nodes:  none Back: without scoliosis/kyphosis , no CVA tendersness Neck: Negative for carotid bruits. JVD 8-10 Lungs: Clear bilaterally to auscultation without wheezes, rales, or rhonchi. Breathing is unlabored. Heart: RRR with S1 S2.  2/6 systolic  murmur , rubs, or gallops appreciated. Abdomen: Soft, non-tender, non-distended with normoactive bowel sounds. No hepatomegaly. No rebound/guarding. No obvious abdominal masses. Msk:  Strength and tone appear normal for age. Extremities: No clubbing or cyanosis.  Tr+  edema.  Distal pedal pulses are 2+ and equal bilaterally. Skin: Warm and Dry Neuro: Alert and oriented X 3. CN III-XII intact Grossly normal sensory and motor function . Psych:  Responds to questions appropriately with a normal affect.      Labs: Cardiac Enzymes No results for input(s): CKTOTAL, CKMB, TROPONINI in the last 72 hours. CBC Lab Results  Component Value Date   WBC 4.8 07/28/2014   HGB 8.4 (L) 07/28/2014   HCT 26.2 (L) 07/28/2014   MCV 91.6 07/28/2014   PLT 109 (L) 07/28/2014    Radiology/Studies:  No results found.  EKG: From the outside hospital demonstrates P synchronous pacing  Device interrogation demonstrates high-frequency noise multiple shocks normal impedance and threshold  Pocket manipulation was associated with high-frequency noise  Assessment and Plan  Inappropriate ICD shocks secondary to noise  Medtronic ICD dual-chamber  Pulmonary  sarcoidosis-oxygen dependent  History of renal failure associated ACE inhibitors  Patient has multiple inappropriate shocks reproduced with manipulation at her device pocket. There is a wire fracture. At this point she is programmed deal low so as to prevent recurrent pacing inhibition in the context of complete heart block. She only device revision. We will reassess LV function. Event that LV function is normal she will receive a single lead; if her LV function is depressed we would anticipate biventricular lead insertion.      * Sherryl MangesSteven Bernadette Armijo

## 2017-02-11 NOTE — H&P (View-Only) (Signed)
CTSP 2/2 asystole 2./2 fracture of ICD lead Initially was pacing and oversensing   Now unable to pace at max output Will plan transvenous pacing for back up   We need to know what LV function is prior to revision as CRT might be most appropriate  BP (!) 144/84   Pulse 79   Resp (!) 25   SpO2 96%  Clear SLOW but reg  HR 20>>35 Sluggish of mind Perfusion <2 sec

## 2017-02-12 ENCOUNTER — Encounter (HOSPITAL_COMMUNITY): Payer: Self-pay

## 2017-02-12 LAB — GLUCOSE, CAPILLARY
GLUCOSE-CAPILLARY: 87 mg/dL (ref 65–99)
GLUCOSE-CAPILLARY: 88 mg/dL (ref 65–99)
Glucose-Capillary: 119 mg/dL — ABNORMAL HIGH (ref 65–99)

## 2017-02-12 MED ORDER — CEFAZOLIN SODIUM-DEXTROSE 2-4 GM/100ML-% IV SOLN
2.0000 g | INTRAVENOUS | Status: AC
  Administered 2017-02-13: 2 g via INTRAVENOUS
  Filled 2017-02-12: qty 100

## 2017-02-12 MED ORDER — SODIUM CHLORIDE 0.9 % IV SOLN
INTRAVENOUS | Status: DC
  Administered 2017-02-13: 04:00:00 via INTRAVENOUS

## 2017-02-12 MED ORDER — CHLORHEXIDINE GLUCONATE 4 % EX LIQD
60.0000 mL | Freq: Once | CUTANEOUS | Status: AC
  Administered 2017-02-13: 4 via TOPICAL
  Filled 2017-02-12: qty 60

## 2017-02-12 MED ORDER — SODIUM CHLORIDE 0.9 % IR SOLN
80.0000 mg | Status: AC
  Administered 2017-02-13: 80 mg
  Filled 2017-02-12: qty 2

## 2017-02-12 MED ORDER — CHLORHEXIDINE GLUCONATE 4 % EX LIQD
60.0000 mL | Freq: Once | CUTANEOUS | Status: AC
  Administered 2017-02-12: 4 via TOPICAL

## 2017-02-12 MED ORDER — IPRATROPIUM-ALBUTEROL 0.5-2.5 (3) MG/3ML IN SOLN
3.0000 mL | Freq: Two times a day (BID) | RESPIRATORY_TRACT | Status: DC
  Administered 2017-02-13 (×2): 3 mL via RESPIRATORY_TRACT
  Filled 2017-02-12 (×2): qty 3

## 2017-02-12 MED ORDER — IPRATROPIUM-ALBUTEROL 0.5-2.5 (3) MG/3ML IN SOLN: 3.0000 mL | Freq: Four times a day (QID) | RESPIRATORY_TRACT | Status: DC | PRN

## 2017-02-12 NOTE — Progress Notes (Signed)
02/12/2017- Respiratory care note- Pt placed on CPAP with 1 lpm oxygen bled into the system.  Pt tolerated well with patient resting comfortably when CPAP was placed on patient.

## 2017-02-12 NOTE — Progress Notes (Signed)
Progress Note  Patient Name: Leah French Date of Encounter: 02/12/2017  Primary Cardiologist: Rosalita Levan Primary Electrophysiologist: [facility]   Patient Profile     71 y.o. female * Admitted with multiple inappropriate ICD shocks with evidence of lead fracture. Initially, pacemaker function was adequate the setting of her complete heart block. She then had failed pacemaker function and underwent temporary transvenous pacing.  She has a history of pulmonary sarcoidosis and presumably cardiac sarcoid prompting ICD implantation (placed at [facility])   Subjective   Feeling better,  No sob or chest pain Anxious following multiple shocks   Inpatient Medications    Scheduled Meds: . aspirin EC  81 mg Oral Daily  . atorvastatin  10 mg Oral q1800  . citalopram  20 mg Oral Daily  . enoxaparin (LOVENOX) injection  40 mg Subcutaneous Q24H  . hydrALAZINE  10 mg Oral Q8H  . ipratropium-albuterol  3 mL Nebulization Q6H  . isosorbide dinitrate  10 mg Oral TID  . mometasone-formoterol  2 puff Inhalation BID  . pantoprazole  40 mg Oral Daily  . predniSONE  10 mg Oral QAC breakfast  . tiZANidine  2 mg Oral TID  . traZODone  25 mg Oral QHS   Continuous Infusions: . sodium chloride 10 mL/hr at 02/12/17 0824  . DOPamine     PRN Meds: midazolam   Vital Signs    Vitals:   02/12/17 0443 02/12/17 0500 02/12/17 0600 02/12/17 0700  BP:  135/61 (!) 130/58 123/61  Pulse:  (!) 50 (!) 50 (!) 49  Resp:  14 14 17   Temp:    98.6 F (37 C)  TempSrc:    Oral  SpO2:  97% 98% 96%  Weight: 197 lb 15.6 oz (89.8 kg)     Height:        Intake/Output Summary (Last 24 hours) at 02/12/17 0844 Last data filed at 02/12/17 0700  Gross per 24 hour  Intake           333.67 ml  Output             1050 ml  Net          -716.33 ml   Filed Weights   02/11/17 2000 02/12/17 0443  Weight: 202 lb 6.1 oz (91.8 kg) 197 lb 15.6 oz (89.8 kg)    Telemetry    V pacing Personally Reviewed  ECG     Physical Exam      GEN: No acute distress.   Neck: JVD 7 cm Cardiac: RRR, 2/6 murmurs, rubs, or gallops.  Respiratory: Clear to auscultation bilaterally. GI: Soft, nontender, non-distended  MS: tr edema; No deformity. Neuro:  Nonfocal  Psych: Normal affect  Skin Warm and dry   Labs    ChemistryNo results for input(s): NA, K, CL, CO2, GLUCOSE, BUN, CREATININE, CALCIUM, PROT, ALBUMIN, AST, ALT, ALKPHOS, BILITOT, GFRNONAA, GFRAA, ANIONGAP in the last 168 hours.   HematologyNo results for input(s): WBC, RBC, HGB, HCT, MCV, MCH, MCHC, RDW, PLT in the last 168 hours.  Cardiac EnzymesNo results for input(s): TROPONINI in the last 168 hours. No results for input(s): TROPIPOC in the last 168 hours.   BNPNo results for input(s): BNP, PROBNP in the last 168 hours.   DDimer No results for input(s): DDIMER in the last 168 hours.   Radiology    No results found.  Cardiac Studies   Echo pending   Device Interrogation    As above  Temp pacer threshold < 1 mA  Assessment & Plan    Complete heart block  Cardiac sarcoidosis  Defibrillator-dual-chamber-Medtronic with failed RV lead  Inappropriate ICD shocks secondary to above  Dyspnea on exertion-class III  Oxygen-dependent COPD   We await  echocardiogram determination as to whether CRT or simply RV pacing lead needs to be revised.  Continue current meds       Signed, Sherryl MangesSteven Nel Stoneking, MD  02/12/2017, 8:44 AM

## 2017-02-13 ENCOUNTER — Inpatient Hospital Stay (HOSPITAL_COMMUNITY): Payer: Medicare Other

## 2017-02-13 ENCOUNTER — Encounter (HOSPITAL_COMMUNITY)
Admission: EM | Disposition: A | Discharge status: Home / Self Care | Payer: Self-pay | Source: Other Acute Inpatient Hospital | Attending: Internal Medicine

## 2017-02-13 ENCOUNTER — Encounter (HOSPITAL_COMMUNITY): Payer: Self-pay | Admitting: Cardiology

## 2017-02-13 DIAGNOSIS — N183 Chronic kidney disease, stage 3 unspecified: Secondary | ICD-10-CM

## 2017-02-13 DIAGNOSIS — Z9981 Dependence on supplemental oxygen: Secondary | ICD-10-CM

## 2017-02-13 DIAGNOSIS — T82198D Other mechanical complication of other cardiac electronic device, subsequent encounter: Secondary | ICD-10-CM

## 2017-02-13 DIAGNOSIS — I469 Cardiac arrest, cause unspecified: Secondary | ICD-10-CM

## 2017-02-13 DIAGNOSIS — J9611 Chronic respiratory failure with hypoxia: Secondary | ICD-10-CM

## 2017-02-13 DIAGNOSIS — I360 Nonrheumatic tricuspid (valve) stenosis: Secondary | ICD-10-CM

## 2017-02-13 DIAGNOSIS — T82198A Other mechanical complication of other cardiac electronic device, initial encounter: Secondary | ICD-10-CM

## 2017-02-13 DIAGNOSIS — Z6841 Body Mass Index (BMI) 40.0 and over, adult: Secondary | ICD-10-CM

## 2017-02-13 HISTORY — PX: ICD REVISION: EP1209

## 2017-02-13 LAB — ECHOCARDIOGRAM COMPLETE
Height: 49 in
Weight: 3199.32 oz

## 2017-02-13 LAB — BASIC METABOLIC PANEL
Anion gap: 7 (ref 5–15)
BUN: 13 mg/dL (ref 6–20)
CHLORIDE: 105 mmol/L (ref 101–111)
CO2: 26 mmol/L (ref 22–32)
Calcium: 7.5 mg/dL — ABNORMAL LOW (ref 8.9–10.3)
Creatinine, Ser: 1.3 mg/dL — ABNORMAL HIGH (ref 0.44–1.00)
GFR calc non Af Amer: 41 mL/min — ABNORMAL LOW (ref >60)
GFR, EST AFRICAN AMERICAN: 47 mL/min — AB (ref >60)
Glucose, Bld: 103 mg/dL — ABNORMAL HIGH (ref 65–99)
Potassium: 3.8 mmol/L (ref 3.5–5.1)
Sodium: 138 mmol/L (ref 135–145)

## 2017-02-13 LAB — CBC
HCT: 36 % (ref 36.0–46.0)
HEMOGLOBIN: 11.4 g/dL — AB (ref 12.0–15.0)
MCH: 27.5 pg (ref 26.0–34.0)
MCHC: 31.7 g/dL (ref 30.0–36.0)
MCV: 86.7 fL (ref 78.0–100.0)
Platelets: 176 10*3/uL (ref 150–400)
RBC: 4.15 MIL/uL (ref 3.87–5.11)
RDW: 17.4 % — ABNORMAL HIGH (ref 11.5–15.5)
WBC: 6.9 10*3/uL (ref 4.0–10.5)

## 2017-02-13 LAB — GLUCOSE, CAPILLARY: Glucose-Capillary: 136 mg/dL — ABNORMAL HIGH (ref 65–99)

## 2017-02-13 LAB — PROTIME-INR
INR: 1.05
PROTHROMBIN TIME: 13.7 s (ref 11.4–15.2)

## 2017-02-13 SURGERY — ICD REVISION

## 2017-02-13 MED ORDER — CEFAZOLIN SODIUM-DEXTROSE 2-4 GM/100ML-% IV SOLN
INTRAVENOUS | Status: AC
  Filled 2017-02-13: qty 100

## 2017-02-13 MED ORDER — SODIUM CHLORIDE 0.9 % IV SOLN: INTRAVENOUS | Status: AC

## 2017-02-13 MED ORDER — CEFAZOLIN SODIUM-DEXTROSE 1-4 GM/50ML-% IV SOLN
1.0000 g | Freq: Four times a day (QID) | INTRAVENOUS | Status: AC
  Administered 2017-02-13 – 2017-02-14 (×3): 1 g via INTRAVENOUS
  Filled 2017-02-13 (×3): qty 50

## 2017-02-13 MED ORDER — GENTAMICIN SULFATE 40 MG/ML IJ SOLN
INTRAMUSCULAR | Status: AC
  Filled 2017-02-13: qty 2

## 2017-02-13 MED ORDER — ACETAMINOPHEN 325 MG PO TABS
325.0000 mg | ORAL_TABLET | ORAL | Status: DC | PRN
  Administered 2017-02-13: 650 mg via ORAL
  Filled 2017-02-13: qty 2

## 2017-02-13 MED ORDER — FENTANYL CITRATE (PF) 100 MCG/2ML IJ SOLN
INTRAMUSCULAR | Status: DC | PRN
  Administered 2017-02-13 (×2): 25 ug via INTRAVENOUS

## 2017-02-13 MED ORDER — IOPAMIDOL (ISOVUE-370) INJECTION 76%
INTRAVENOUS | Status: DC | PRN
  Administered 2017-02-13: 15 mL via INTRAVENOUS

## 2017-02-13 MED ORDER — MIDAZOLAM HCL 5 MG/5ML IJ SOLN
INTRAMUSCULAR | Status: AC
  Filled 2017-02-13: qty 5

## 2017-02-13 MED ORDER — FENTANYL CITRATE (PF) 100 MCG/2ML IJ SOLN
INTRAMUSCULAR | Status: AC
  Filled 2017-02-13: qty 2

## 2017-02-13 MED ORDER — MIDAZOLAM HCL 5 MG/5ML IJ SOLN
INTRAMUSCULAR | Status: DC | PRN
  Administered 2017-02-13 (×2): 1 mg via INTRAVENOUS

## 2017-02-13 MED ORDER — LIDOCAINE HCL (PF) 1 % IJ SOLN
INTRAMUSCULAR | Status: AC
  Filled 2017-02-13: qty 60

## 2017-02-13 MED ORDER — LIDOCAINE HCL (PF) 1 % IJ SOLN
INTRAMUSCULAR | Status: DC | PRN
  Administered 2017-02-13: 40 mL

## 2017-02-13 MED ORDER — ONDANSETRON HCL 4 MG/2ML IJ SOLN: 4.0000 mg | Freq: Four times a day (QID) | INTRAMUSCULAR | Status: DC | PRN

## 2017-02-13 MED ORDER — IPRATROPIUM-ALBUTEROL 0.5-2.5 (3) MG/3ML IN SOLN: 3.0000 mL | RESPIRATORY_TRACT | Status: DC | PRN

## 2017-02-13 MED ORDER — HEPARIN (PORCINE) IN NACL 2-0.9 UNIT/ML-% IJ SOLN
INTRAMUSCULAR | Status: AC | PRN
  Administered 2017-02-13: 500 mL

## 2017-02-13 SURGICAL SUPPLY — 8 items
CABLE SURGICAL S-101-97-12 (CABLE) ×3 IMPLANT
HEMOSTAT SURGICEL 2X4 FIBR (HEMOSTASIS) ×3 IMPLANT
ICD EVERA DR XT MRI DDMB1D4 (ICD Generator) ×3 IMPLANT
LEAD SPRINT QUAT SEC 6935M-62 (Lead) ×3 IMPLANT
PAD DEFIB LIFELINK (PAD) ×3 IMPLANT
SHEATH CLASSIC 9F (SHEATH) ×3 IMPLANT
TRAY PACEMAKER INSERTION (PACKS) ×3 IMPLANT
WIRE HI TORQ VERSACORE-J 145CM (WIRE) ×3 IMPLANT

## 2017-02-13 NOTE — Progress Notes (Addendum)
Progress Note  Patient Name: Leah French Date of Encounter: 02/13/2017  Primary Cardiologist: Lucienne Minks  Subjective   No complaints this AM denies sob and chest pain Reviewed echo with dr Elease Hashimoto  EF mostly normal  Hypotension last night resolved   Inpatient Medications    Scheduled Meds: . aspirin EC  81 mg Oral Daily  . atorvastatin  10 mg Oral q1800  . citalopram  20 mg Oral Daily  . gentamicin irrigation  80 mg Irrigation On Call  . hydrALAZINE  10 mg Oral Q8H  . ipratropium-albuterol  3 mL Nebulization BID  . isosorbide dinitrate  10 mg Oral TID  . mometasone-formoterol  2 puff Inhalation BID  . pantoprazole  40 mg Oral Daily  . predniSONE  10 mg Oral QAC breakfast  . tiZANidine  2 mg Oral TID  . traZODone  25 mg Oral QHS   Continuous Infusions: . sodium chloride 10 mL/hr at 02/12/17 0824  . sodium chloride 50 mL/hr at 02/13/17 0424  . sodium chloride 50 mL/hr at 02/13/17 0424  .  ceFAZolin (ANCEF) IV    . DOPamine     PRN Meds: ipratropium-albuterol, midazolam   Vital Signs    Vitals:   02/13/17 0400 02/13/17 0500 02/13/17 0525 02/13/17 0600  BP: (!) 112/49  (!) 112/49   Pulse: (!) 59 60  (!) 58  Resp: 17 (!) 34  13  Temp:      TempSrc:      SpO2: 94% 96%  97%  Weight: 199 lb 15.3 oz (90.7 kg)     Height:        Intake/Output Summary (Last 24 hours) at 02/13/17 0800 Last data filed at 02/13/17 0700  Gross per 24 hour  Intake              754 ml  Output              600 ml  Net              154 ml   Filed Weights   02/11/17 2000 02/12/17 0443 02/13/17 0400  Weight: 202 lb 6.1 oz (91.8 kg) 197 lb 15.6 oz (89.8 kg) 199 lb 15.3 oz (90.7 kg)    Telemetry    V paced - Personally Reviewed  ECG    V paced - Personally Reviewed  Physical Exam   GEN: No acute distress.   Neck: No JVD Cardiac: RRR, no murmurs, rubs, or gallops.  Respiratory: Clear to auscultation bilaterally, no wheezing, no rales, rhonchi GI: Soft, nontender, non-distended    MS: No edema; No deformity. Neuro:  Nonfocal  Psych: Normal affect  No chest wall collaterals   Labs    Chemistry Recent Labs Lab 02/13/17 0259  NA 138  K 3.8  CL 105  CO2 26  GLUCOSE 103*  BUN 13  CREATININE 1.30*  CALCIUM 7.5*  GFRNONAA 41*  GFRAA 47*  ANIONGAP 7     Hematology Recent Labs Lab 02/13/17 0259  WBC 6.9  RBC 4.15  HGB 11.4*  HCT 36.0  MCV 86.7  MCH 27.5  MCHC 31.7  RDW 17.4*  PLT 176     Radiology    No results found.  Cardiac Studies   Echo at bedside is in progress  12/2/615: TTE Study Conclusions - Left ventricle: The cavity size was normal. Wall thickness was normal. Systolic function was normal. The estimated ejection fraction was in the range of 60% to 65%. Wall motion  was normal; there were no regional wall motion abnormalities. Doppler parameters are consistent with abnormal left ventricular relaxation (grade 1 diastolic dysfunction). Impressions: - Normal LV function; grade 1 diastolic dysfunction.  Patient Profile     71 y.o. female admitted 02/11/17 with inappropriate ICD shocks (43).  PMHx of sarcoidosis, heart block with ICD in place, DM, HTN, anxiety, ARF requiring transient CRRT felt 2/2 dehydration and ACE use, ? RN reports hx of old CVA.  Device interrogation notes shock inappropriate 2/2 lead noise, reproducible with pocket manipulation with lead HV lead fracture and loss of pacing ability temp pacing wire for pacing support  Assessment & Plan    1. ICD lead fracture numerous inappropriate shocks and loss of pacing     CHB     Tachy therapies off, s/p temp pacing wire     Planned for lead revision today     Bedside echo is in progress   I discussed risks/benefits and lead revision, new RV lead and perhaps LV lead with the patient and her daughter at bedside, they are agreeable to proceed.    2. Pulmonary sarcoid, COPD     Chronic O2  Signed, Sheilah PigeonRenee Lynn Ursuy, PA-C  02/13/2017, 8:00 AM    Echo As  above  For HV lead replacement today Have reviewed the potential benefits and risks of ICD implantation including but not limited to death, perforation of heart or lung, lead dislodgement, infection,  device malfunction and inappropriate shocks.  The patient and  *express understanding  and are willing to proceed.

## 2017-02-13 NOTE — Progress Notes (Signed)
02/13/2017- Respiratory care note- Pt placed on CPAP for the night. Tolerating well.

## 2017-02-13 NOTE — Progress Notes (Signed)
Orthopedic Tech Progress Note Patient Details:  Leah French [birth year] [id number] Patient has sling. Patient ID: Leah BolsterShirley M French, female   DOB: [birth year], 71 y.o.   MRN: [id number]   [name], [name] 02/13/2017, 7:43 PM

## 2017-02-13 NOTE — Progress Notes (Signed)
Placed patient on CPAP for the night with oxygen set at 2lpm. 

## 2017-02-13 NOTE — Discharge Summary (Signed)
ELECTROPHYSIOLOGY PROCEDURE DISCHARGE SUMMARY    Patient ID: Leah French,  MRN: [id number], DOB/AGE: [birth year] 71 y.o.  Admit date: 02/11/2017 Discharge date: 02/15/17  Primary Care Physician: Dr. Andrey Campanile Primary Cardiologist: patient/daughter state she is pending referral/newly assigned cardiologist Electrophysiologist: new to Dr. Graciela Husbands  Primary Discharge Diagnosis:  1. Multiple ICD shocks     Inappropriate for lead fracture     (uncertain initial indication for original device implant/felt likely secondary to sarcoid history)  Secondary Discharge Diagnosis:  1. CHB 2. Sarcoidosis (pulmnoary) 3. DM 4. HTN 5. COPD     Prn home O2 6. Dementia     (lives with daughter)   Allergies  Allergen Reactions  . Ace Inhibitors Other (See Comments)    Lisinopril caused renal failure     Procedures This Admission:  1.  Implantation of a new RV/HV lead and new ICD generator on 02/13/17 by Dr Graciela Husbands.  The patient received a Medtronic 6935-lead serial number [id number], Medtronic MRI compatible pulse generator, serial number [id number] DFT's were deferred at time of implant.   There were no immediate post procedure complications. 2. Temporary pacing wire removal 3.  CXR on 02/14/17 demonstrated no pneumothorax status post device implantation.   Brief HPI: Leah French is a 71 y.o. female was admitted to [facility] 02/11/17 getting multiple shocks (43) from her ICD.  Past medical history includes as noted above.  Her device was interrogated noting fractured RV/HV lead.   Hospital Course:  The patient was admitted her device reprogrammed to avoid inappropriate shocks though eventually as well loss pacing ability requiring temporary pacing wire placement.  Echo noted LVEF 60-65% and she underwent implantation of a new RV/HV lead with details as outlined above. She was monitored on telemetry, she had prior to her procedure an episode of symptomatic NSVT/flutter, and BB was added to her  regime, and post procedure maintained SR/Vpaced rhythm without recurrent arrhythmia, electrolytes were wnl.  Left chest was without hematoma or ecchymosis.  The device was interrogated post procedure day #1 and found to be functioning normally.  CXR was obtained and demonstrated no pneumothorax status post device implantation.  Wound care, arm mobility, and restrictions were reviewed with the patient and daughter.  The patient has ambulated with RN (with walker which is her baseline) and did well, no SOB or complaints.  The patient was examined by Dr. Ladona Ridgel, and considered stable for discharge to home.    Physical Exam: Vitals:   02/15/17 0155 02/15/17 0640 02/15/17 0900 02/15/17 0941  BP: (!) 150/61 (!) 150/72 (!) 126/49   Pulse: 72 79 79 79  Resp: 15 20 20    Temp: 98.8 F (37.1 C) 98.2 F (36.8 C) 98.4 F (36.9 C)   TempSrc: Oral Oral Oral   SpO2: 96% 95% 94%   Weight:  191 lb 4.8 oz (86.8 kg)    Height:        GEN- The patient is well appearing, alert and oriented x 3 today.   HEENT: normocephalic, atraumatic; sclera clear, conjunctiva pink; hearing intact; oropharynx clear Lungs- CTA b/l, normal work of breathing.  No wheezes, rales, rhonchi Heart- RRR, no murmurs, rubs or gallops, PMI not laterally displaced GI- soft, non-tender, non-distended Extremities- no clubbing, cyanosis, or edema; R groin/femroal vien site stable without bleeding/hematoma MS- no significant deformity or atrophy Skin- warm and dry, no rash or lesion, left chest without hematoma/ecchymosis Psych- euthymic mood, full affect Neuro- no gross defecits  Labs:  Lab Results  Component Value Date   WBC 6.9 02/13/2017   HGB 11.4 (L) 02/13/2017   HCT 36.0 02/13/2017   MCV 86.7 02/13/2017   PLT 176 02/13/2017     Recent Labs Lab 02/14/17 1125  NA 137  K 3.8  CL 105  CO2 24  BUN 9  CREATININE 1.06*  CALCIUM 7.9*  GLUCOSE 117*    Discharge Medications:  Allergies as of 02/15/2017      Reactions     Ace Inhibitors Other (See Comments)   Lisinopril caused renal failure      Medication List    TAKE these medications   acetaminophen 500 MG tablet Commonly known as:  TYLENOL Take 1,000 mg by mouth every 6 (six) hours as needed for headache.   albuterol 0.63 MG/3ML nebulizer solution Commonly known as:  ACCUNEB Take 1 ampule by nebulization every 6 (six) hours as needed for wheezing or shortness of breath.   PROAIR HFA 108 (90 Base) MCG/ACT inhaler Generic drug:  albuterol Inhale 2 puffs into the lungs every 6 (six) hours as needed for wheezing or shortness of breath.   amLODipine 10 MG tablet Commonly known as:  NORVASC Take 10 mg by mouth daily.   aspirin EC 81 MG tablet Take 81 mg by mouth daily.   atorvastatin 40 MG tablet Commonly known as:  LIPITOR Take 40 mg by mouth at bedtime.   citalopram 40 MG tablet Commonly known as:  CELEXA Take 40 mg by mouth at bedtime.   diclofenac sodium 1 % Gel Commonly known as:  VOLTAREN Apply 1 application topically 4 (four) times daily as needed (pain).   divalproex 250 MG DR tablet Commonly known as:  DEPAKOTE Take 250 mg by mouth 2 (two) times daily.   donepezil 10 MG tablet Commonly known as:  ARICEPT Take 10 mg by mouth at bedtime.   ferrous sulfate 325 (65 FE) MG tablet Take 325 mg by mouth 2 (two) times daily.   Fish Oil 1000 MG Caps Take 1,000 mg by mouth 2 (two) times daily.   Fluticasone-Salmeterol 250-50 MCG/DOSE Aepb Commonly known as:  ADVAIR Inhale 1 puff into the lungs 2 (two) times daily.   furosemide 40 MG tablet Commonly known as:  LASIX Take 20 mg by mouth daily.   gabapentin 600 MG tablet Commonly known as:  NEURONTIN Take 600 mg by mouth at bedtime.   hydrALAZINE 10 MG tablet Commonly known as:  APRESOLINE Take 1 tablet (10 mg total) by mouth every 8 (eight) hours.   metoprolol succinate 25 MG 24 hr tablet Commonly known as:  TOPROL-XL Take 1 tablet (25 mg total) by mouth daily.    omeprazole 20 MG capsule Commonly known as:  PRILOSEC Take 20 mg by mouth daily.   potassium chloride 10 MEQ tablet Commonly known as:  K-DUR,KLOR-CON Take 10 mEq by mouth 2 (two) times daily. What changed:  Another medication with the same name was removed. Continue taking this medication, and follow the directions you see here.   predniSONE 10 MG tablet Commonly known as:  DELTASONE Take 10 mg by mouth daily.   tiZANidine 2 MG tablet Commonly known as:  ZANAFLEX Take 2 mg by mouth 3 (three) times daily.   traZODone 50 MG tablet Commonly known as:  DESYREL Take 75 mg by mouth at bedtime.   VISINE OP Place 1 drop into both eyes daily as needed (dry eyes).       Disposition: Home with family Discharge Instructions  Diet - low sodium heart healthy    Complete by:  As directed    Increase activity slowly    Complete by:  As directed      Follow-up Information    [facility] Follow up on 02/27/2017.   Specialty:  Cardiology Why:  4:00PM, wound check Contact information: [address] [place] [address] [phone]       Duke Salvia, MD Follow up on 05/23/2017.   Specialty:  Cardiology Why:  3;15PM Contact information: [address] [place] [address] [phone]           Duration of Discharge Encounter: Greater than 30 minutes including physician time.  Norma Fredrickson, PA-C 02/15/2017 9:47 AM

## 2017-02-14 ENCOUNTER — Inpatient Hospital Stay (HOSPITAL_COMMUNITY): Payer: Medicare Other

## 2017-02-14 ENCOUNTER — Encounter (HOSPITAL_COMMUNITY): Payer: Self-pay | Admitting: Internal Medicine

## 2017-02-14 LAB — MAGNESIUM: MAGNESIUM: 2 mg/dL (ref 1.7–2.4)

## 2017-02-14 LAB — BASIC METABOLIC PANEL
ANION GAP: 8 (ref 5–15)
BUN: 9 mg/dL (ref 6–20)
CO2: 24 mmol/L (ref 22–32)
Calcium: 7.9 mg/dL — ABNORMAL LOW (ref 8.9–10.3)
Chloride: 105 mmol/L (ref 101–111)
Creatinine, Ser: 1.06 mg/dL — ABNORMAL HIGH (ref 0.44–1.00)
GFR calc non Af Amer: 52 mL/min — ABNORMAL LOW (ref >60)
Glucose, Bld: 117 mg/dL — ABNORMAL HIGH (ref 65–99)
Potassium: 3.8 mmol/L (ref 3.5–5.1)
SODIUM: 137 mmol/L (ref 135–145)

## 2017-02-14 MED ORDER — METOPROLOL SUCCINATE ER 25 MG PO TB24
25.0000 mg | ORAL_TABLET | Freq: Every day | ORAL | Status: DC
  Administered 2017-02-14 – 2017-02-15 (×2): 25 mg via ORAL
  Filled 2017-02-14 (×2): qty 1

## 2017-02-14 NOTE — Progress Notes (Signed)
[facility].

## 2017-02-14 NOTE — Progress Notes (Signed)
Attempted report x1. 

## 2017-02-14 NOTE — Progress Notes (Addendum)
Progress Note  Patient Name: Leah French Date of Encounter: 02/14/2017  Primary Cardiologist: Lucienne Minks  Subjective   No complaints this AM denies sob and chest pain, denies any procedure site discomfort  Inpatient Medications    Scheduled Meds: . aspirin EC  81 mg Oral Daily  . atorvastatin  10 mg Oral q1800  . citalopram  20 mg Oral Daily  . hydrALAZINE  10 mg Oral Q8H  . isosorbide dinitrate  10 mg Oral TID  . metoprolol succinate  25 mg Oral Daily  . mometasone-formoterol  2 puff Inhalation BID  . pantoprazole  40 mg Oral Daily  . tiZANidine  2 mg Oral TID  . traZODone  25 mg Oral QHS   Continuous Infusions: . sodium chloride 10 mL/hr at 02/13/17 1900  .  ceFAZolin (ANCEF) IV Stopped (02/14/17 0700)  . DOPamine     PRN Meds: acetaminophen, ipratropium-albuterol, midazolam, ondansetron (ZOFRAN) IV   Vital Signs    Vitals:   02/14/17 0400 02/14/17 0500 02/14/17 0725 02/14/17 0800  BP: 116/71 134/63  (!) 146/75  Pulse: 60 64  71  Resp:      Temp: 98.1 F (36.7 C)   98.5 F (36.9 C)  TempSrc: Oral   Oral  SpO2: 92% (!) 88% 100% 99%  Weight: 196 lb 13.9 oz (89.3 kg)     Height:        Intake/Output Summary (Last 24 hours) at 02/14/17 1001 Last data filed at 02/14/17 0800  Gross per 24 hour  Intake             1120 ml  Output             1350 ml  Net             -230 ml   Filed Weights   02/12/17 0443 02/13/17 0400 02/14/17 0400  Weight: 197 lb 15.6 oz (89.8 kg) 199 lb 15.3 oz (90.7 kg) 196 lb 13.9 oz (89.3 kg)    Telemetry    SR, V paced - Personally Reviewed  ECG    SR, V paced - Personally Reviewed, VT/Vflutter episode self terminated (prior to device/lead procedure)  Physical Exam   GEN: No acute distress.  Sitting up Neck: No JVD Cardiac: RRR, no murmurs, rubs, or gallops.  Respiratory: CTA b/l, no wheezing, no rales, rhonchi GI: Soft, nontender, non-distended  MS: No edema; No deformity. Neuro:  Nonfocal  Psych: Normal affect  ICD  site is dry, no hematoma or ecchymosis  Labs    Chemistry  Recent Labs Lab 02/13/17 0259  NA 138  K 3.8  CL 105  CO2 26  GLUCOSE 103*  BUN 13  CREATININE 1.30*  CALCIUM 7.5*  GFRNONAA 41*  GFRAA 47*  ANIONGAP 7     Hematology  Recent Labs Lab 02/13/17 0259  WBC 6.9  RBC 4.15  HGB 11.4*  HCT 36.0  MCV 86.7  MCH 27.5  MCHC 31.7  RDW 17.4*  PLT 176     Radiology    Dg Chest 2 View Result Date: 02/14/2017 CLINICAL DATA:  Cardiac arrhythmia with pacemaker placement EXAM: CHEST  2 VIEW COMPARISON:  February 11, 2017 FINDINGS: Pacemaker leads are attached to the right atrium and right ventricle with an apparent new lead attached the right ventricle since most recent prior study. No pneumothorax. There is mild right base atelectasis. Lungs elsewhere are clear. Heart is upper normal in size with pulmonary vascularity within normal limits. No adenopathy.  There is degenerative change in the thoracic spine and in each shoulder. IMPRESSION: Pacemaker leads attached to right atrium and right ventricle. No pneumothorax. Mild right base atelectasis. No edema or consolidation. Stable cardiac silhouette. Electronically Signed   By: Bretta BangWilliam  Woodruff III M.D.   On: 02/14/2017 07:24    Cardiac Studies   02/13/17: TTE Study Conclusions - Left ventricle: The cavity size was normal. There was moderate   focal basal and mild concentric hypertrophy. Systolic function   was normal. The estimated ejection fraction was in the range of   60% to 65%. Wall motion was normal; there were no regional wall   motion abnormalities. - Aortic valve: Trileaflet; normal thickness, mildly calcified   leaflets. - Mitral valve: There was mild regurgitation directed eccentrically   and toward the free wall. - Pulmonic valve: There was trivial regurgitation. - Pulmonary arteries: PA peak pressure: 35 mm Hg (S). - Pericardium, extracardiac: There was a right pleural effusion. Impressions: - The right  ventricular systolic pressure was increased consistent   with mild pulmonary hypertension.  12/2/615: TTE Study Conclusions - Left ventricle: The cavity size was normal. Wall thickness was normal. Systolic function was normal. The estimated ejection fraction was in the range of 60% to 65%. Wall motion was normal; there were no regional wall motion abnormalities. Doppler parameters are consistent with abnormal left ventricular relaxation (grade 1 diastolic dysfunction). Impressions: - Normal LV function; grade 1 diastolic dysfunction.  Patient Profile     71 y.o. female admitted 02/11/17 with inappropriate ICD shocks (43).  PMHx of sarcoidosis, heart block with ICD in place, DM, HTN, anxiety, ARF requiring transient CRRT felt 2/2 dehydration and ACE use, ? RN reports hx of old CVA.  Device interrogation notes shock inappropriate 2/2 lead noise, reproducible with pocket manipulation with lead HV lead fracture and loss of pacing ability temp pacing wire for pacing support  Assessment & Plan    1. ICD lead fracture numerous inappropriate shocks and loss of pacing     CHB     LVEF  60-65%, new RV lead was placed yesterday      CXR this AM without ptx     Wound care and activity restrictions were discussed with the patient     Device check this morning with stable lead measurements, device therapies programmed ON  2. Pulmonary sarcoid, COPD     Chronic O2  3. NSVT     ?V flutter, symptomatic by daughter's account transiently had LOC with it     Will add Toprol     Check electrolytes  Patient is seen by Dr. Graciela HusbandsKlein Will transfer to telemetry    Signed, Sheilah Pigeonenee Lynn Ursuy, PA-C  02/14/2017, 10:01 AM

## 2017-02-14 NOTE — Progress Notes (Signed)
Pt refuse CPAP for the night.  

## 2017-02-14 NOTE — Care Management Note (Signed)
Case Management Note  Patient Details  Name: Leah French MRN: [id number] Date of Birth: 02-08-1946  Subjective/Objective:  Pt admitted due to ICU delivering inappropriate shocks                 Action/Plan:   PTA from home.  Pt is now s/p bedside lead revision.  CM will continue to follow for discharge needs   Expected Discharge Date:                  Expected Discharge Plan:  Home/Self Care  In-House Referral:     Discharge planning Services  CM Consult  Post Acute Care Choice:    Choice offered to:     DME Arranged:    DME Agency:     HH Arranged:    HH Agency:     Status of Service:     If discussed at [employer] Length of Stay Meetings, dates discussed:    Additional Comments:  [name], [name], RN 02/14/2017, 3:43 PM

## 2017-02-14 NOTE — Progress Notes (Signed)
New Admission Note: Transfer from 2 heart  Arrival Method: Bed with nurse and daughter Mental Orientation: A&O x4 Telemetry: initiated Skin: bruising on arms, MASD under breast, abdominal folds. Incision on left chest from pacemaker repair, incision on right groin from external pacing wires. Pain: denies  Orders to be reviewed and implemented. Will continue to monitor the patient. Call light has been placed within reach and bed alarm has been activated.   Teagan Heidrick, RN Phone: [phone]   

## 2017-02-15 MED ORDER — METOPROLOL SUCCINATE ER 25 MG PO TB24: 25.0000 mg | ORAL_TABLET | Freq: Every day | ORAL | 6 refills | Status: DC

## 2017-02-15 MED ORDER — HYDRALAZINE HCL 10 MG PO TABS: 10.0000 mg | ORAL_TABLET | Freq: Three times a day (TID) | ORAL | 6 refills | Status: DC

## 2017-02-15 NOTE — Plan of Care (Signed)
Problem: Cardiac: Goal: Ability to achieve and maintain adequate cardiopulmonary perfusion will improve Outcome: Completed/Met Date Met: 02/15/17 Uses oxygen at home at baseline

## 2017-02-15 NOTE — Progress Notes (Signed)
Discharge instructions reviewed with patient and daughter, questions answered, verbalized understanding.  Patient transported to front of hospital via wheelchair to be taken home by daughter.   

## 2017-02-15 NOTE — Discharge Instructions (Signed)
° ° °  Right groin care instructions No heavy lifting, and activity instructions as below, Keep procedure site clean & dry. If you notice increased pain, swelling, bleeding or pus, call/return!  No soaking baths/hot tubs/pools for 1 week.     Supplemental Discharge Instructions for  Pacemaker/Defibrillator Patients  Activity No heavy lifting or vigorous activity with your left/right arm for 6 to 8 weeks.  Do not raise your left/right arm above your head for one week.  Gradually raise your affected arm as drawn below.              02/17/17                    02/18/17                     02/19/17                  02/20/17 __  NO DRIVING the patient does not drive  WOUND CARE - Keep the wound area clean and dry.  Do not get this area wet, no showers for 24 hours; you may shower on 02/15/17  . - The tape/steri-strips on your wound will fall off; do not pull them off.  No bandage is needed on the site.  DO  NOT apply any creams, oils, or ointments to the wound area. - If you notice any drainage or discharge from the wound, any swelling or bruising at the site, or you develop a fever > 101? F after you are discharged home, call the office at once.  Special Instructions - You are still able to use cellular telephones; use the ear opposite the side where you have your pacemaker/defibrillator.  Avoid carrying your cellular phone near your device. - When traveling through airports, show security personnel your identification card to avoid being screened in the metal detectors.  Ask the security personnel to use the hand wand. - Avoid arc welding equipment, MRI testing (magnetic resonance imaging), TENS units (transcutaneous nerve stimulators).  Call the office for questions about other devices. - Avoid electrical appliances that are in poor condition or are not properly grounded. - Microwave ovens are safe to be near or to operate.  Additional information for defibrillator patients should your device go  off: - If your device goes off ONCE and you feel fine afterward, notify the device clinic nurses. - If your device goes off ONCE and you do not feel well afterward, call 911. - If your device goes off TWICE, call 911. - If your device goes off THREE times in one day, call 911.  DO NOT DRIVE YOURSELF OR A FAMILY MEMBER WITH A DEFIBRILLATOR TO THE HOSPITAL--CALL 911.

## 2017-02-15 NOTE — Progress Notes (Signed)
Pure wick placed. Patient says she feels when she has to use the bathroom sometimes and sometimes she doesn't. Daughter at bedside.

## 2017-02-27 ENCOUNTER — Ambulatory Visit (INDEPENDENT_AMBULATORY_CARE_PROVIDER_SITE_OTHER): Payer: Medicare Other | Admitting: *Deleted

## 2017-02-27 DIAGNOSIS — I442 Atrioventricular block, complete: Secondary | ICD-10-CM

## 2017-02-27 LAB — CUP PACEART INCLINIC DEVICE CHECK
Battery Remaining Longevity: 108 mo
Battery Voltage: 3.06 V
Brady Statistic AP VP Percent: 47.26 %
Brady Statistic AP VS Percent: 0.16 %
Brady Statistic AS VP Percent: 52.05 %
Brady Statistic AS VS Percent: 0.53 %
Brady Statistic RV Percent Paced: 99.31 %
HIGH POWER IMPEDANCE MEASURED VALUE: 52 Ohm
Implantable Lead Implant Date: 20180716
Implantable Lead Location: 753860
Implantable Lead Model: 5076
Lead Channel Impedance Value: 475 Ohm
Lead Channel Impedance Value: 494 Ohm
Lead Channel Pacing Threshold Amplitude: 0.5 V
Lead Channel Pacing Threshold Amplitude: 0.5 V
Lead Channel Pacing Threshold Pulse Width: 0.4 ms
Lead Channel Sensing Intrinsic Amplitude: 2.25 mV
Lead Channel Sensing Intrinsic Amplitude: 2.5 mV
Lead Channel Setting Pacing Amplitude: 3.5 V
Lead Channel Setting Pacing Pulse Width: 0.4 ms
MDC IDC LEAD IMPLANT DT: 20061006
MDC IDC LEAD LOCATION: 753859
MDC IDC MSMT LEADCHNL RA PACING THRESHOLD PULSEWIDTH: 0.4 ms
MDC IDC MSMT LEADCHNL RV IMPEDANCE VALUE: 570 Ohm
MDC IDC MSMT LEADCHNL RV SENSING INTR AMPL: 4.375 mV
MDC IDC PG IMPLANT DT: 20180716
MDC IDC SESS DTM: 20180730163418
MDC IDC SET LEADCHNL RA PACING AMPLITUDE: 1.5 V
MDC IDC SET LEADCHNL RV SENSING SENSITIVITY: 0.3 mV
MDC IDC STAT BRADY RA PERCENT PACED: 47.4 %

## 2017-02-27 NOTE — Progress Notes (Signed)
Wound check appointment. Dermabond removed. Wound without redness or edema. Incision edges approximated, wound well healed. Normal device function. Thresholds, sensing, and impedances consistent with implant measurements. RV programmed at 3.5V for extra safety margin until 3 month visit. Histogram distribution appropriate for patient and level of activity. No mode switches or ventricular arrhythmias noted. Patient educated about wound care, arm mobility, lifting restrictions, shock plan. ROV w/ SK 05/23/2017

## 2017-04-19 ENCOUNTER — Telehealth: Payer: Self-pay | Admitting: Internal Medicine

## 2017-04-19 NOTE — Telephone Encounter (Signed)
[facility] tech services to order new monitor- old one was supposed to be sent back to [employer] for some reason. Representative unsure himself. I made daughter aware- they will come to device clinic tomorrow at 4 for an ICD check in office.

## 2017-04-19 NOTE — Telephone Encounter (Signed)
Spoke with daughter- pt feels fine, they were not doing anything at the time of the shocks. She has pain under her arm but thinks it may be related to not using the arm very much since the procedure. Monitor not assigned in [facility]- monitor SN unavailable in [facility]. I am calling tech services to complete and will call daughter back.

## 2017-04-19 NOTE — Telephone Encounter (Signed)
[name]( Daughter) is calling because Leah French had a pacemaker repair back in July and now she is saying she is hurting under her arm , and she felt two shocks on Sunday . Please call

## 2017-04-20 ENCOUNTER — Ambulatory Visit (INDEPENDENT_AMBULATORY_CARE_PROVIDER_SITE_OTHER): Payer: Medicare Other | Admitting: *Deleted

## 2017-04-20 DIAGNOSIS — I442 Atrioventricular block, complete: Secondary | ICD-10-CM

## 2017-04-20 LAB — CUP PACEART INCLINIC DEVICE CHECK
Date Time Interrogation Session: 20180920165215
Implantable Lead Implant Date: 20061006
Implantable Lead Implant Date: 20180716
Implantable Lead Location: 753860
Implantable Lead Model: 5076
MDC IDC LEAD LOCATION: 753859
MDC IDC PG IMPLANT DT: 20180716

## 2017-04-20 NOTE — Progress Notes (Signed)
Device interrogated d/t pt thinking she got a shock. No episodes noted on interrogation. Pt stated that it just felt like a small sharp pain that didn't last long . Informed pt that this could be some nerve pain. Reassured pt that her device was working normally. Pt also complained of some pain under her arm, pt to follow up with her primary care provider. ROV w/ SK 05/23/2017

## 2017-05-15 ENCOUNTER — Telehealth: Payer: Self-pay | Admitting: Cardiology

## 2017-05-15 NOTE — Telephone Encounter (Signed)
Patient daughter called and stated that pt believes she received a shock for her ICD. Pt states that she felt fine before the shock and she feels fine now. Instructed pt daughter to send a manual transmission w/ pt home monitor.

## 2017-05-15 NOTE — Telephone Encounter (Signed)
Manual transmission received.  No episodes, alerts, or abnormalities noted.  Presenting rhythm is As/Vp in the 70s.  Histograms appropriate.  Attempted to reach patient x2, LMOVM requesting call back to the Device Clinic as no DPR on file.  Gave direct phone number.

## 2017-05-22 NOTE — Telephone Encounter (Signed)
Spoke with patient, received verbal permission to disclose information to daughter.  Requested that she fill out a DPR form tomorrow at her appointment with Dr. Graciela HusbandsKlein.  Patient and daughter verbalize understanding.  Advised patient's daughter that manual transmission revealed no alerts or abnormalities.  She verbalizes understanding and denies questions or concerns at this time.

## 2017-05-23 ENCOUNTER — Ambulatory Visit (INDEPENDENT_AMBULATORY_CARE_PROVIDER_SITE_OTHER): Payer: Medicare Other | Admitting: Internal Medicine

## 2017-05-23 ENCOUNTER — Encounter: Payer: Self-pay | Admitting: Internal Medicine

## 2017-05-23 VITALS — BP 110/66 | HR 70 | Ht 59.0 in | Wt 204.4 lb

## 2017-05-23 DIAGNOSIS — Z95810 Presence of automatic (implantable) cardiac defibrillator: Secondary | ICD-10-CM | POA: Diagnosis not present

## 2017-05-23 DIAGNOSIS — I429 Cardiomyopathy, unspecified: Secondary | ICD-10-CM

## 2017-05-23 DIAGNOSIS — I442 Atrioventricular block, complete: Secondary | ICD-10-CM

## 2017-05-23 DIAGNOSIS — I10 Essential (primary) hypertension: Secondary | ICD-10-CM

## 2017-05-23 NOTE — Patient Instructions (Addendum)
Medication Instructions: Your physician has recommended you make the following change in your medication:  -1) STOP Hydralazine (Apresoline)  Labwork: None Ordered  Procedures/Testing: None Ordered  Follow-Up: Remote monitoring is used to monitor your ICD from home. This monitoring reduces the number of office visits required to check your device to one time per year. It allows us to keep an eye on the functioning of your device to ensure it is working properly. You are scheduled for a device check from home on 08/22/17. You may send your transmission at any time that day. If you have a wireless device, the transmission will be sent automatically. After your physician reviews your transmission, you will receive a postcard with your next transmission date.  Your physician wants you to follow-up in: 9 MONTHS with Francis Dowseenee Ursuy, PA-C. You will receive a reminder letter in the mail two months in advance. If you don't receive a letter, please call our office to schedule the follow-up appointment.   If you need a refill on your cardiac medications before your next appointment, please call your pharmacy.

## 2017-05-23 NOTE — Progress Notes (Signed)
Patient Care Team: Georganna Skeans, MD as PCP - General (Family Medicine)   HPI  Leah French is a 71 y.o. female Seen in follow-up for an ICD implanted at [facility]. 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 She was seen by [place] for the first time 7/18 following multiple inappropriate discharges left (#43) related to lead fracture She received a new lead;  The old lead was retained   Echo EF 60-65%  LVH  No interval ICD discharges. Functional status is stable. Her mental health is doing a little bit better. Still with some anxiety. She is wearing oxygen.  Records and Results Reviewed*   Past Medical History:  Diagnosis Date  . Anginal pain (HCC)   . Anxiety   . Chronic kidney disease    st 3  . Depression   . Diabetes mellitus without complication (HCC)   . Dysrhythmia   . GERD (gastroesophageal reflux disease)   . Headache   . Heart block AV third degree (HCC) 2016  . History of placement of internal cardiac defibrillator 2016  . Hypertension   . Medical history non-contributory   . Medical history reviewed with no changes   . Myocardial infarction (HCC)   . OSA on CPAP   . Sarcoidosis of lung (HCC)    by report  . Shortness of breath dyspnea     Past Surgical History:  Procedure Laterality Date  . CHOLECYSTECTOMY    . ICD REVISION N/A 02/13/2017   Procedure: ICD Revision;  Surgeon: Duke Salvia, MD;  Location: [facility] INVASIVE CV LAB;  Service: Cardiovascular;  Laterality: N/A;  . TEMPORARY PACEMAKER N/A 02/11/2017   Procedure: Temporary Pacemaker;  Surgeon: [place], Peter M, MD;  Location: [facility] INVASIVE CV LAB;  Service: Cardiovascular;  Laterality: N/A;    Current Outpatient Prescriptions  Medication Sig Dispense Refill  . acetaminophen (TYLENOL) 500 MG tablet Take 1,000 mg by mouth every 6 (six) hours as needed for headache.    . albuterol (ACCUNEB) 0.63 MG/3ML nebulizer solution Take 1 ampule by nebulization every 6  (six) hours as needed for wheezing or shortness of breath.    Marland Kitchen albuterol (PROAIR HFA) 108 (90 Base) MCG/ACT inhaler Inhale 2 puffs into the lungs every 6 (six) hours as needed for wheezing or shortness of breath.    Marland Kitchen amLODipine (NORVASC) 10 MG tablet Take 10 mg by mouth daily.  0  . aspirin EC 81 MG tablet Take 81 mg by mouth daily.    Marland Kitchen atorvastatin (LIPITOR) 40 MG tablet Take 40 mg by mouth at bedtime.  0  . diclofenac sodium (VOLTAREN) 1 % GEL Apply 1 application topically 4 (four) times daily as needed (pain).    . ferrous sulfate 325 (65 FE) MG tablet Take 325 mg by mouth 2 (two) times daily.  3  . Fluticasone-Salmeterol (ADVAIR) 250-50 MCG/DOSE AEPB Inhale 1 puff into the lungs 2 (two) times daily.    . furosemide (LASIX) 40 MG tablet Take 20 mg by mouth daily.   0  . gabapentin (NEURONTIN) 600 MG tablet Take 600 mg by mouth at bedtime.  0  . hydrALAZINE (APRESOLINE) 10 MG tablet Take 1 tablet (10 mg total) by mouth every 8 (eight) hours. 90 tablet 6  . metoprolol succinate (TOPROL-XL) 25 MG 24 hr tablet Take 1 tablet (25 mg total) by mouth daily. 30 tablet 6  . Omega-3 Fatty Acids (FISH OIL) 1000 MG CAPS Take  1,000 mg by mouth 2 (two) times daily.    Marland Kitchen. omeprazole (PRILOSEC) 20 MG capsule Take 20 mg by mouth daily.  3  . potassium chloride (K-DUR,KLOR-CON) 10 MEQ tablet Take 10 mEq by mouth 2 (two) times daily.    . predniSONE (DELTASONE) 10 MG tablet Take 10 mg by mouth daily.  2  . Tetrahydrozoline HCl (VISINE OP) Place 1 drop into both eyes daily as needed (dry eyes).    Marland Kitchen. tiZANidine (ZANAFLEX) 2 MG tablet Take 2 mg by mouth 3 (three) times daily.  0  . traZODone (DESYREL) 50 MG tablet Take 75 mg by mouth at bedtime.  3   No current facility-administered medications for this visit.    Facility-Administered Medications Ordered in Other Visits  Medication Dose Route Frequency Provider Last Rate Last Dose  . etomidate (AMIDATE) injection    Anesthesia Intra-op Alanda AmassFriedman, Scot A, CRNA    6 mg at 07/24/14 2251  . succinylcholine (ANECTINE) injection    Anesthesia Intra-op Alanda AmassFriedman, Scot A, CRNA   100 mg at 07/24/14 2251    Allergies  Allergen Reactions  . Ace Inhibitors Other (See Comments)    Lisinopril caused renal failure  . Lisinopril Other (See Comments)    Severe life threatening Hyperkalemia resulting in dialysis      Review of Systems negative except from HPI and PMH  Physical Exam BP 110/66   Pulse 70   Ht 4\' 11"  (1.499 m)   Wt 204 lb 6.4 oz (92.7 kg)   BMI 41.28 kg/m  Well developed and Morbidly obese  in mild  acute distress wearing )2 HENT normal E scleral and icterus clear Neck Supple JVP flat; carotids brisk and full Clear to ausculation Device pocket well healed; without hematoma or erythema.  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  The patient does not  have concerns regarding medicines.

## 2017-06-03 LAB — CUP PACEART INCLINIC DEVICE CHECK
Battery Remaining Longevity: 109 mo
Brady Statistic AP VP Percent: 25.34 %
Brady Statistic AP VS Percent: 0 %
Brady Statistic AS VP Percent: 74.64 %
Brady Statistic AS VS Percent: 0.02 %
Brady Statistic RV Percent Paced: 99.97 %
Date Time Interrogation Session: 20181023201516
HighPow Impedance: 64 Ohm
Implantable Lead Implant Date: 20061006
Implantable Lead Location: 753860
Lead Channel Impedance Value: 475 Ohm
Lead Channel Impedance Value: 627 Ohm
Lead Channel Pacing Threshold Amplitude: 0.5 V
Lead Channel Pacing Threshold Amplitude: 0.5 V
Lead Channel Pacing Threshold Pulse Width: 0.4 ms
Lead Channel Sensing Intrinsic Amplitude: 2.875 mV
Lead Channel Sensing Intrinsic Amplitude: 3.125 mV
Lead Channel Setting Pacing Amplitude: 1.5 V
Lead Channel Setting Pacing Amplitude: 2.5 V
MDC IDC LEAD IMPLANT DT: 20180716
MDC IDC LEAD LOCATION: 753859
MDC IDC MSMT BATTERY VOLTAGE: 3.04 V
MDC IDC MSMT LEADCHNL RA PACING THRESHOLD PULSEWIDTH: 0.4 ms
MDC IDC MSMT LEADCHNL RV IMPEDANCE VALUE: 570 Ohm
MDC IDC PG IMPLANT DT: 20180716
MDC IDC SET LEADCHNL RV PACING PULSEWIDTH: 0.4 ms
MDC IDC SET LEADCHNL RV SENSING SENSITIVITY: 0.3 mV
MDC IDC STAT BRADY RA PERCENT PACED: 25.34 %

## 2017-06-23 DIAGNOSIS — R531 Weakness: Secondary | ICD-10-CM | POA: Diagnosis not present

## 2017-06-23 DIAGNOSIS — D848 Other specified immunodeficiencies: Secondary | ICD-10-CM | POA: Diagnosis not present

## 2017-06-23 DIAGNOSIS — N390 Urinary tract infection, site not specified: Secondary | ICD-10-CM | POA: Diagnosis not present

## 2017-08-22 ENCOUNTER — Telehealth: Payer: Self-pay | Admitting: Cardiology

## 2017-08-22 ENCOUNTER — Encounter: Payer: Medicare Other | Admitting: *Deleted

## 2017-08-22 NOTE — Telephone Encounter (Signed)
Spoke with pt and reminded pt of remote transmission that is due today. Pt verbalized understanding.   

## 2017-08-24 ENCOUNTER — Encounter: Payer: Self-pay | Admitting: Cardiology

## 2017-09-05 ENCOUNTER — Ambulatory Visit (INDEPENDENT_AMBULATORY_CARE_PROVIDER_SITE_OTHER): Payer: Medicare Other | Admitting: *Deleted

## 2017-09-05 DIAGNOSIS — I429 Cardiomyopathy, unspecified: Secondary | ICD-10-CM

## 2017-09-06 ENCOUNTER — Encounter: Payer: Self-pay | Admitting: Cardiology

## 2017-09-06 NOTE — Progress Notes (Signed)
Remote ICD transmission.   

## 2017-09-08 ENCOUNTER — Telehealth: Payer: Self-pay | Admitting: Internal Medicine

## 2017-09-08 NOTE — Telephone Encounter (Signed)
Transmission received no episodes, no therapies. Informed pts daughter that sometimes if a pt is asleep and is startled awake they can think they have received a shock, pts daughter stated that pt is very afraid of receiving a shock, informed pts daughter that this is normal for people to be hyperaware of receiving a shock and think that they have received one and have not, pts daughter voiced understanding

## 2017-09-08 NOTE — Telephone Encounter (Signed)
Leah French is calling because she sent a transmission and [name] says she had a shock on last night . Please call

## 2017-09-27 LAB — CUP PACEART REMOTE DEVICE CHECK
Battery Remaining Longevity: 106 mo
Battery Voltage: 3 V
Brady Statistic AP VP Percent: 16.54 %
Brady Statistic AP VS Percent: 0 %
Brady Statistic AS VP Percent: 83.45 %
Brady Statistic RA Percent Paced: 16.54 %
Brady Statistic RV Percent Paced: 99.99 %
Date Time Interrogation Session: 20190205181200
HIGH POWER IMPEDANCE MEASURED VALUE: 64 Ohm
Implantable Lead Implant Date: 20061006
Implantable Lead Implant Date: 20180716
Implantable Lead Location: 753859
Implantable Pulse Generator Implant Date: 20180716
Lead Channel Impedance Value: 437 Ohm
Lead Channel Impedance Value: 513 Ohm
Lead Channel Impedance Value: 627 Ohm
Lead Channel Pacing Threshold Amplitude: 0.375 V
Lead Channel Pacing Threshold Amplitude: 0.5 V
Lead Channel Pacing Threshold Pulse Width: 0.4 ms
Lead Channel Sensing Intrinsic Amplitude: 3.125 mV
Lead Channel Setting Pacing Amplitude: 1.5 V
Lead Channel Setting Pacing Amplitude: 2.5 V
Lead Channel Setting Pacing Pulse Width: 0.4 ms
Lead Channel Setting Sensing Sensitivity: 0.3 mV
MDC IDC LEAD LOCATION: 753860
MDC IDC MSMT LEADCHNL RA PACING THRESHOLD PULSEWIDTH: 0.4 ms
MDC IDC MSMT LEADCHNL RA SENSING INTR AMPL: 2.375 mV
MDC IDC MSMT LEADCHNL RA SENSING INTR AMPL: 2.375 mV
MDC IDC MSMT LEADCHNL RV SENSING INTR AMPL: 3.125 mV
MDC IDC STAT BRADY AS VS PERCENT: 0.01 %

## 2017-09-28 ENCOUNTER — Other Ambulatory Visit: Payer: Self-pay | Admitting: Physician Assistant

## 2017-12-05 ENCOUNTER — Telehealth: Payer: Self-pay | Admitting: Cardiology

## 2017-12-05 ENCOUNTER — Ambulatory Visit (INDEPENDENT_AMBULATORY_CARE_PROVIDER_SITE_OTHER): Payer: Medicare Other | Admitting: *Deleted

## 2017-12-05 DIAGNOSIS — I429 Cardiomyopathy, unspecified: Secondary | ICD-10-CM

## 2017-12-05 NOTE — Telephone Encounter (Signed)
Confirmed remote transmission w/ pt daughter.   

## 2017-12-06 NOTE — Progress Notes (Signed)
Remote ICD transmission.   

## 2017-12-07 ENCOUNTER — Encounter: Payer: Self-pay | Admitting: Cardiology

## 2017-12-26 LAB — CUP PACEART REMOTE DEVICE CHECK
Battery Remaining Longevity: 104 mo
Battery Voltage: 2.98 V
Brady Statistic AP VP Percent: 13.68 %
Brady Statistic AP VS Percent: 0 %
Brady Statistic AS VP Percent: 86.31 %
Brady Statistic AS VS Percent: 0.01 %
Brady Statistic RA Percent Paced: 13.67 %
Date Time Interrogation Session: 20190507190607
HighPow Impedance: 76 Ohm
Implantable Lead Implant Date: 20061006
Implantable Lead Location: 753859
Implantable Pulse Generator Implant Date: 20180716
Lead Channel Impedance Value: 475 Ohm
Lead Channel Impedance Value: 684 Ohm
Lead Channel Pacing Threshold Amplitude: 0.5 V
Lead Channel Pacing Threshold Amplitude: 0.625 V
Lead Channel Pacing Threshold Pulse Width: 0.4 ms
Lead Channel Pacing Threshold Pulse Width: 0.4 ms
Lead Channel Sensing Intrinsic Amplitude: 2.375 mV
Lead Channel Sensing Intrinsic Amplitude: 3.125 mV
Lead Channel Sensing Intrinsic Amplitude: 3.125 mV
Lead Channel Setting Pacing Amplitude: 1.5 V
Lead Channel Setting Pacing Pulse Width: 0.4 ms
MDC IDC LEAD IMPLANT DT: 20180716
MDC IDC LEAD LOCATION: 753860
MDC IDC MSMT LEADCHNL RA SENSING INTR AMPL: 2.375 mV
MDC IDC MSMT LEADCHNL RV IMPEDANCE VALUE: 589 Ohm
MDC IDC SET LEADCHNL RV PACING AMPLITUDE: 2.5 V
MDC IDC SET LEADCHNL RV SENSING SENSITIVITY: 0.3 mV
MDC IDC STAT BRADY RV PERCENT PACED: 99.97 %

## 2017-12-28 ENCOUNTER — Telehealth: Payer: Self-pay | Admitting: Internal Medicine

## 2017-12-28 NOTE — Telephone Encounter (Signed)
New message    Patient would like to know if it is safe for her to travel on airplane with device. Requesting nurse call   1. Has your device fired? NO  2. Is you device beeping? NO  3. Are you experiencing draining or swelling at device site? NO  4. Are you calling to see if we received your device transmission? NO  5. Have you passed out? NO    Please route to Device Clinic Pool

## 2017-12-28 NOTE — Telephone Encounter (Signed)
Spoke with pts daughter who wanted to know if it was ok for pt to fly in an airplane informed her that is was safer for pt to fly,.

## 2018-01-02 ENCOUNTER — Telehealth: Payer: Self-pay | Admitting: Cardiology

## 2018-01-02 NOTE — Telephone Encounter (Signed)
Spoke with pts daughter informed her that I had reviewed the remote transmission and that the lead measurements were stable and there were no episodes, pts daughter voiced understanding.

## 2018-01-02 NOTE — Telephone Encounter (Signed)
Patient daughter called and stated that pt felt some tingling sensation around her device site on the left side. Pt daughter sent a remote transmission.

## 2018-01-27 ENCOUNTER — Other Ambulatory Visit: Payer: Self-pay | Admitting: Physician Assistant

## 2018-02-05 ENCOUNTER — Other Ambulatory Visit: Payer: Self-pay | Admitting: Physician Assistant

## 2018-02-19 ENCOUNTER — Encounter: Payer: Medicare Other | Admitting: Physician Assistant

## 2018-03-06 ENCOUNTER — Ambulatory Visit (INDEPENDENT_AMBULATORY_CARE_PROVIDER_SITE_OTHER): Payer: Medicare Other | Admitting: *Deleted

## 2018-03-06 DIAGNOSIS — I442 Atrioventricular block, complete: Secondary | ICD-10-CM | POA: Diagnosis not present

## 2018-03-07 NOTE — Progress Notes (Signed)
Remote ICD transmission.   

## 2018-03-27 LAB — CUP PACEART REMOTE DEVICE CHECK
Battery Remaining Longevity: 100 mo
Brady Statistic AP VP Percent: 18.83 %
Brady Statistic AP VS Percent: 0 %
Brady Statistic AS VP Percent: 81.16 %
Brady Statistic AS VS Percent: 0.01 %
Brady Statistic RA Percent Paced: 18.83 %
Brady Statistic RV Percent Paced: 99.99 %
Date Time Interrogation Session: 20190806052405
HIGH POWER IMPEDANCE MEASURED VALUE: 82 Ohm
Implantable Lead Implant Date: 20061006
Implantable Lead Implant Date: 20180716
Implantable Lead Location: 753860
Implantable Lead Model: 5076
Lead Channel Impedance Value: 437 Ohm
Lead Channel Impedance Value: 513 Ohm
Lead Channel Impedance Value: 627 Ohm
Lead Channel Pacing Threshold Amplitude: 0.5 V
Lead Channel Pacing Threshold Pulse Width: 0.4 ms
Lead Channel Sensing Intrinsic Amplitude: 3.125 mV
Lead Channel Sensing Intrinsic Amplitude: 3.125 mV
Lead Channel Setting Pacing Amplitude: 1.5 V
Lead Channel Setting Pacing Amplitude: 2.5 V
Lead Channel Setting Pacing Pulse Width: 0.4 ms
Lead Channel Setting Sensing Sensitivity: 0.3 mV
MDC IDC LEAD LOCATION: 753859
MDC IDC MSMT BATTERY VOLTAGE: 2.96 V
MDC IDC MSMT LEADCHNL RA PACING THRESHOLD AMPLITUDE: 0.625 V
MDC IDC MSMT LEADCHNL RA PACING THRESHOLD PULSEWIDTH: 0.4 ms
MDC IDC MSMT LEADCHNL RA SENSING INTR AMPL: 2.75 mV
MDC IDC MSMT LEADCHNL RA SENSING INTR AMPL: 2.75 mV
MDC IDC PG IMPLANT DT: 20180716

## 2018-04-03 ENCOUNTER — Encounter: Payer: Medicare Other | Admitting: Physician Assistant

## 2018-04-08 DIAGNOSIS — N179 Acute kidney failure, unspecified: Secondary | ICD-10-CM

## 2018-04-08 DIAGNOSIS — D869 Sarcoidosis, unspecified: Secondary | ICD-10-CM

## 2018-04-08 DIAGNOSIS — K219 Gastro-esophageal reflux disease without esophagitis: Secondary | ICD-10-CM

## 2018-04-08 DIAGNOSIS — E872 Acidosis: Secondary | ICD-10-CM | POA: Diagnosis not present

## 2018-04-08 DIAGNOSIS — F0390 Unspecified dementia without behavioral disturbance: Secondary | ICD-10-CM

## 2018-04-08 DIAGNOSIS — N390 Urinary tract infection, site not specified: Secondary | ICD-10-CM

## 2018-04-08 DIAGNOSIS — B372 Candidiasis of skin and nail: Secondary | ICD-10-CM

## 2018-04-08 DIAGNOSIS — G9341 Metabolic encephalopathy: Secondary | ICD-10-CM

## 2018-04-08 DIAGNOSIS — G4733 Obstructive sleep apnea (adult) (pediatric): Secondary | ICD-10-CM

## 2018-04-08 DIAGNOSIS — E1142 Type 2 diabetes mellitus with diabetic polyneuropathy: Secondary | ICD-10-CM

## 2018-04-09 DIAGNOSIS — G9341 Metabolic encephalopathy: Secondary | ICD-10-CM | POA: Diagnosis not present

## 2018-04-09 DIAGNOSIS — N390 Urinary tract infection, site not specified: Secondary | ICD-10-CM | POA: Diagnosis not present

## 2018-04-10 DIAGNOSIS — G9341 Metabolic encephalopathy: Secondary | ICD-10-CM | POA: Diagnosis not present

## 2018-04-10 DIAGNOSIS — N390 Urinary tract infection, site not specified: Secondary | ICD-10-CM | POA: Diagnosis not present

## 2018-04-11 DIAGNOSIS — N390 Urinary tract infection, site not specified: Secondary | ICD-10-CM | POA: Diagnosis not present

## 2018-04-11 DIAGNOSIS — G9341 Metabolic encephalopathy: Secondary | ICD-10-CM | POA: Diagnosis not present

## 2018-05-12 NOTE — Progress Notes (Signed)
Cardiology Office Note Date:  05/14/2018  Patient ID:  Leah French, Leah French December 31, 1945, MRN [id number] PCP:  Georganna Skeans, MD  Electrophysiologist:  Dr. Graciela Husbands    Chief Complaint: routine EP visit  History of Present Illness: Leah French is a 72 y.o. female with history of extrapulmonary sarcoid, heart block, presumably cardiac sarcoid by chart with an ICD, DM, HTN, CKD (III), O2 dependent COPD.  She has hx of numerous inappropriate shocks (42) 2/2 lead fracture with some degree of PTSD.  She comes today to be seen for Dr. Graciela Husbands.  She last saw him in Oct 2018.  At that time her hydralazine was stopped 2/2 lower BP with recommendations to follow weekly BP checks at home.    She is accompanied by her daughter who is her caregiver.  Her daughter mentions her Walker Kehr is starting to decline.  They both report she is doing fairly well, getting PT to encourage ambulation and activity, as well as family getting her out and about.  She has had a couple mechanical falls, no dizzy spells, no near syncope or syncope.  She is following wiith orthopedic MD for a bad knee, hoping she will not require surgery.  The patient denies any CP, palpitations.  No SOB with her level of activity, she has a hospital bed though reoprt [facility] is minimally elevated with only 1-2 pillow use.  She denies any symptoms of PND or orthopnea, uses her CPAP every night (with O2)   Device information: MDT duial chamber ICD, implanted 2006, gen change/RV lead 02/13/17 Hx of inappopriate shocks 2/2 lead fracture >> lead revision, old lead remains/abandoned  Past Medical History:  Diagnosis Date  . Anginal pain (HCC)   . Anxiety   . Chronic kidney disease    st 3  . Depression   . Diabetes mellitus without complication (HCC)   . Dysrhythmia   . GERD (gastroesophageal reflux disease)   . Headache   . Heart block AV third degree (HCC) 2016  . History of placement of internal cardiac defibrillator 2016  . Hypertension   .  Medical history non-contributory   . Medical history reviewed with no changes   . Myocardial infarction (HCC)   . OSA on CPAP   . Sarcoidosis of lung (HCC)    by report  . Shortness of breath dyspnea     Past Surgical History:  Procedure Laterality Date  . CHOLECYSTECTOMY    . ICD REVISION N/A 02/13/2017   Procedure: ICD Revision;  Surgeon: Duke Salvia, MD;  [facility] INVASIVE CV LAB;  Service: Cardiovascular;  Laterality: N/A;  . TEMPORARY PACEMAKER N/A 02/11/2017   Procedure: Temporary Pacemaker;  Surgeon: [place], Peter M, MD;  Location: [facility] INVASIVE CV LAB;  Service: Cardiovascular;  Laterality: N/A;    Current Outpatient Medications  Medication Sig Dispense Refill  . acetaminophen (TYLENOL) 500 MG tablet Take 1,000 mg by mouth every 6 (six) hours as needed for headache.    . albuterol (ACCUNEB) 0.63 MG/3ML nebulizer solution Take 1 ampule by nebulization every 6 (six) hours as needed for wheezing or shortness of breath.    Marland Kitchen albuterol (PROAIR HFA) 108 (90 Base) MCG/ACT inhaler Inhale 2 puffs into the lungs every 6 (six) hours as needed for wheezing or shortness of breath.    Marland Kitchen amLODipine (NORVASC) 10 MG tablet Take 10 mg by mouth daily.  0  . aspirin EC 81 MG tablet Take 81 mg by mouth daily.    Marland Kitchen atorvastatin (  LIPITOR) 40 MG tablet Take 40 mg by mouth at bedtime.  0  . citalopram (CELEXA) 40 MG tablet Take 40 mg by mouth daily.    . divalproex (DEPAKOTE) 250 MG DR tablet Take 250 mg by mouth 2 (two) times daily.    . ferrous sulfate 325 (65 FE) MG tablet Take 325 mg by mouth 2 (two) times daily.  3  . Fluticasone-Salmeterol (ADVAIR) 250-50 MCG/DOSE AEPB Inhale 1 puff into the lungs 2 (two) times daily.    . furosemide (LASIX) 40 MG tablet Take 20 mg by mouth daily.   0  . gabapentin (NEURONTIN) 600 MG tablet Take 600 mg by mouth at bedtime.  0  . hydrALAZINE (APRESOLINE) 10 MG tablet Take 10 mg by mouth 3 (three) times daily.    . metoprolol succinate (TOPROL-XL) 25 MG 24 hr  tablet TAKE 1 TABLET BY MOUTH DAILY 30 tablet 10  . Omega-3 Fatty Acids (FISH OIL) 1000 MG CAPS Take 1,000 mg by mouth 2 (two) times daily.    Marland Kitchen oxybutynin (DITROPAN) 5 MG tablet Take 5 mg by mouth 2 (two) times daily.    . potassium chloride (K-DUR,KLOR-CON) 10 MEQ tablet Take 10 mEq by mouth 2 (two) times daily.    . predniSONE (DELTASONE) 10 MG tablet Take 10 mg by mouth daily.  2  . Tetrahydrozoline HCl (VISINE OP) Place 1 drop into both eyes daily as needed (dry eyes).    Marland Kitchen tiZANidine (ZANAFLEX) 2 MG tablet Take 2 mg by mouth 3 (three) times daily.  0   No current facility-administered medications for this visit.    Facility-Administered Medications Ordered in Other Visits  Medication Dose Route Frequency Provider Last Rate Last Dose  . etomidate (AMIDATE) injection    Anesthesia Intra-op Alanda Amass A, CRNA   6 mg at 07/24/14 2251  . succinylcholine (ANECTINE) injection    Anesthesia Intra-op Alanda Amass A, CRNA   100 mg at 07/24/14 2251    Allergies:   Ace inhibitors and Lisinopril   Social History:  The patient  reports that she has never smoked. She has never used smokeless tobacco. She reports that she does not drink alcohol or use drugs.   Family History:  The patient's family history is not on file.  ROS:  Please see the history of present illness.  All other systems are reviewed and otherwise negative.   PHYSICAL EXAM:  VS:  BP 116/72   Pulse 77   Ht 4\' 11"  (1.499 m)   Wt 165 lb (74.8 kg)   BMI 33.33 kg/m  BMI: Body mass index is 33.33 kg/m. Well nourished, well developed, in no acute distress  HEENT: normocephalic, atraumatic  Neck: no JVD, carotid bruits or masses Cardiac: RRR; no significant murmurs, no rubs, or gallops Lungs:  CTA b/l, no wheezing, rhonchi or rales  Abd: soft, nontender MS: no deformity, age appropriate atrophy Ext: no pitting edema edema is appreciated  Skin: warm and dry, no rash Neuro:  No gross deficits appreciated Psych: euthymic  mood, full affect   ICD site is stable, no tethering or discomfort   EKG:  Done today and reviewed by myself: SR, V pcaed ICD interrogation done today and reviewed by myself: battery and lead measurements are good.  No R waves at 40bpm, one NSVT only, optivol suggests fluid OL   02/13/17: TTE Study Conclusions - Left ventricle: The cavity size was normal. There was moderate   focal basal and mild concentric hypertrophy. Systolic function  was normal. The estimated ejection fraction was in the range of   60% to 65%. Wall motion was normal; there were no regional wall   motion abnormalities. - Aortic valve: Trileaflet; normal thickness, mildly calcified   leaflets. - Mitral valve: There was mild regurgitation directed eccentrically   and toward the free wall. - Pulmonic valve: There was trivial regurgitation. - Pulmonary arteries: PA peak pressure: 35 mm Hg (S). - Pericardium, extracardiac: There was a right pleural effusion. Impressions: - The right ventricular systolic pressure was increased consistent   with mild pulmonary hypertension.  Recent Labs: No results found for requested labs within last 8760 hours.  No results found for requested labs within last 8760 hours.   CrCl cannot be calculated (Patient's most recent lab result is older than the maximum 21 days allowed.).   Wt Readings from Last 3 Encounters:  05/14/18 165 lb (74.8 kg)  05/23/17 204 lb 6.4 oz (92.7 kg)  02/15/17 191 lb 4.8 oz (86.8 kg)     Other studies reviewed: Additional studies/records reviewed today include: summarized above  ASSESSMENT AND PLAN:  1. CHB, cardiac sarcoid w/ICD     Intact device function     100% VP     No R waves at 40     One NSVT episode only  2. HTN     Looks good  3. Optivol suggests fluid OL     She has no symptom or exam findings to suggest fluid OL.  She has no pitting edema with a brawny type appearance of her LE      continue same lasix/K+     The patient's  daughter reports labs done with PMD office      She is down >40 lbs from last year, this has been attributed to poor intake particularly with recurrent UTIs/abx use.  Encouraged to keep in communication with her PMD closely regarding nutritional status Encouraged daily weights to identify any rapid gains/fluid retention as well.   Disposition: F/u with q 3 mo remotes, and in-clinic in 1 year, sooner if needed.  They live in [place], and inquire about f/u in that office going forward which will be much easier for them.  Discussed this would make Dr. Elberta Fortis her primary EP, they understand, and the convenience and short commute will be much better for them  Current medicines are reviewed at length with the patient today.  The patient did not have any concerns regarding medicines  Signed, Francis Dowse, PA-C 05/14/2018 12:20 PM     [facility] [address] [place] [address] [phone] (office)  [phone] (fax)

## 2018-05-14 ENCOUNTER — Ambulatory Visit (INDEPENDENT_AMBULATORY_CARE_PROVIDER_SITE_OTHER): Payer: Medicare Other | Admitting: Physician Assistant

## 2018-05-14 VITALS — BP 116/72 | HR 77 | Ht 59.0 in | Wt 165.0 lb

## 2018-05-14 DIAGNOSIS — I442 Atrioventricular block, complete: Secondary | ICD-10-CM | POA: Diagnosis not present

## 2018-05-14 DIAGNOSIS — I10 Essential (primary) hypertension: Secondary | ICD-10-CM | POA: Diagnosis not present

## 2018-05-14 DIAGNOSIS — D8685 Sarcoid myocarditis: Secondary | ICD-10-CM

## 2018-05-14 DIAGNOSIS — Z95810 Presence of automatic (implantable) cardiac defibrillator: Secondary | ICD-10-CM

## 2018-05-14 NOTE — Patient Instructions (Addendum)
Medication Instructions:   Your physician recommends that you continue on your current medications as directed. Please refer to the Current Medication list given to you today.   If you need a refill on your cardiac medications before your next appointment, please call your pharmacy.  Labwork: NONE ORDERED  TODAY    Testing/Procedures: NONE ORDERED  TODAY    Follow-Up:  Your physician wants you to follow-up in: ONE YEAR WITH  [facility]   You will receive a reminder letter in the mail two months in advance. If you don't receive a letter, please call our office to schedule the follow-up appointment.   Remote monitoring is used to monitor your Pacemaker of ICD from home. This monitoring reduces the number of office visits required to check your device to one time per year. It allows [place] to keep an eye on the functioning of your device to ensure it is working properly. You are scheduled for a device check from home on .06-05-18  You may send your transmission at any time that day. If you have a wireless device, the transmission will be sent automatically. After your physician reviews your transmission, you will receive a postcard with your next transmission date.    Any Other Special Instructions Will Be Listed Below (If Applicable).

## 2018-06-05 ENCOUNTER — Ambulatory Visit (INDEPENDENT_AMBULATORY_CARE_PROVIDER_SITE_OTHER): Payer: Medicare Other | Admitting: *Deleted

## 2018-06-05 DIAGNOSIS — I429 Cardiomyopathy, unspecified: Secondary | ICD-10-CM

## 2018-06-05 DIAGNOSIS — I442 Atrioventricular block, complete: Secondary | ICD-10-CM

## 2018-06-06 NOTE — Progress Notes (Signed)
Remote ICD transmission.   

## 2018-06-10 ENCOUNTER — Encounter: Payer: Self-pay | Admitting: Cardiology

## 2018-06-15 ENCOUNTER — Other Ambulatory Visit: Payer: Self-pay | Admitting: Physician Assistant

## 2018-06-15 NOTE — Telephone Encounter (Signed)
Pt pharmacy requesting refill on Rx that was discontinued on 05/2017 but is still on pt med list. Please address. Thank you.

## 2018-07-26 ENCOUNTER — Other Ambulatory Visit: Payer: Self-pay | Admitting: Physician Assistant

## 2018-08-05 LAB — CUP PACEART REMOTE DEVICE CHECK
Battery Remaining Longevity: 96 mo
Battery Voltage: 2.95 V
Brady Statistic AP VP Percent: 11.49 %
Brady Statistic AS VP Percent: 88.51 %
Brady Statistic RA Percent Paced: 11.49 %
Brady Statistic RV Percent Paced: 99.99 %
Date Time Interrogation Session: 20191105051604
HighPow Impedance: 71 Ohm
Implantable Lead Implant Date: 20061006
Implantable Lead Implant Date: 20180716
Implantable Lead Location: 753859
Lead Channel Impedance Value: 437 Ohm
Lead Channel Impedance Value: 551 Ohm
Lead Channel Impedance Value: 589 Ohm
Lead Channel Pacing Threshold Pulse Width: 0.4 ms
Lead Channel Pacing Threshold Pulse Width: 0.4 ms
Lead Channel Sensing Intrinsic Amplitude: 2.25 mV
Lead Channel Sensing Intrinsic Amplitude: 3.125 mV
Lead Channel Sensing Intrinsic Amplitude: 3.125 mV
Lead Channel Setting Pacing Amplitude: 1.5 V
Lead Channel Setting Sensing Sensitivity: 0.3 mV
MDC IDC LEAD LOCATION: 753860
MDC IDC MSMT LEADCHNL RA PACING THRESHOLD AMPLITUDE: 0.625 V
MDC IDC MSMT LEADCHNL RA SENSING INTR AMPL: 2.25 mV
MDC IDC MSMT LEADCHNL RV PACING THRESHOLD AMPLITUDE: 0.5 V
MDC IDC PG IMPLANT DT: 20180716
MDC IDC SET LEADCHNL RV PACING AMPLITUDE: 2.5 V
MDC IDC SET LEADCHNL RV PACING PULSEWIDTH: 0.4 ms
MDC IDC STAT BRADY AP VS PERCENT: 0 %
MDC IDC STAT BRADY AS VS PERCENT: 0 %

## 2018-09-04 ENCOUNTER — Ambulatory Visit (INDEPENDENT_AMBULATORY_CARE_PROVIDER_SITE_OTHER): Payer: Medicare Other

## 2018-09-04 DIAGNOSIS — I429 Cardiomyopathy, unspecified: Secondary | ICD-10-CM

## 2018-09-04 DIAGNOSIS — I442 Atrioventricular block, complete: Secondary | ICD-10-CM | POA: Diagnosis not present

## 2018-09-06 LAB — CUP PACEART REMOTE DEVICE CHECK
Battery Remaining Longevity: 92 mo
Battery Voltage: 2.93 V
Brady Statistic AP VP Percent: 28.19 %
Brady Statistic AP VS Percent: 0 %
Brady Statistic RV Percent Paced: 99.99 %
Date Time Interrogation Session: 20200204072825
HIGH POWER IMPEDANCE MEASURED VALUE: 74 Ohm
Implantable Lead Implant Date: 20061006
Implantable Lead Implant Date: 20180716
Implantable Lead Location: 753860
Implantable Lead Model: 5076
Implantable Pulse Generator Implant Date: 20180716
Lead Channel Impedance Value: 513 Ohm
Lead Channel Pacing Threshold Amplitude: 0.5 V
Lead Channel Pacing Threshold Amplitude: 0.5 V
Lead Channel Pacing Threshold Pulse Width: 0.4 ms
Lead Channel Sensing Intrinsic Amplitude: 2.25 mV
Lead Channel Sensing Intrinsic Amplitude: 3.125 mV
Lead Channel Setting Pacing Amplitude: 2.5 V
Lead Channel Setting Pacing Pulse Width: 0.4 ms
MDC IDC LEAD LOCATION: 753859
MDC IDC MSMT LEADCHNL RA IMPEDANCE VALUE: 475 Ohm
MDC IDC MSMT LEADCHNL RA PACING THRESHOLD PULSEWIDTH: 0.4 ms
MDC IDC MSMT LEADCHNL RA SENSING INTR AMPL: 2.25 mV
MDC IDC MSMT LEADCHNL RV IMPEDANCE VALUE: 570 Ohm
MDC IDC MSMT LEADCHNL RV SENSING INTR AMPL: 3.125 mV
MDC IDC SET LEADCHNL RA PACING AMPLITUDE: 1.5 V
MDC IDC SET LEADCHNL RV SENSING SENSITIVITY: 0.3 mV
MDC IDC STAT BRADY AS VP PERCENT: 71.8 %
MDC IDC STAT BRADY AS VS PERCENT: 0.01 %
MDC IDC STAT BRADY RA PERCENT PACED: 28.19 %

## 2018-09-13 NOTE — Progress Notes (Signed)
Remote ICD transmission.   

## 2018-12-11 ENCOUNTER — Other Ambulatory Visit: Payer: Self-pay

## 2018-12-11 ENCOUNTER — Encounter: Payer: Medicare Other | Admitting: *Deleted

## 2018-12-12 ENCOUNTER — Telehealth: Payer: Self-pay

## 2018-12-12 NOTE — Telephone Encounter (Signed)
Left message for patient to remind of missed remote transmission.  

## 2019-02-04 ENCOUNTER — Ambulatory Visit (INDEPENDENT_AMBULATORY_CARE_PROVIDER_SITE_OTHER): Payer: Medicare Other | Admitting: *Deleted

## 2019-02-04 DIAGNOSIS — I442 Atrioventricular block, complete: Secondary | ICD-10-CM | POA: Diagnosis not present

## 2019-02-04 LAB — CUP PACEART REMOTE DEVICE CHECK
Battery Remaining Longevity: 87 mo
Battery Voltage: 2.99 V
Brady Statistic AP VP Percent: 36.02 %
Brady Statistic AP VS Percent: 0 %
Brady Statistic AS VP Percent: 63.98 %
Brady Statistic AS VS Percent: 0.01 %
Brady Statistic RA Percent Paced: 35.95 %
Brady Statistic RV Percent Paced: 99.99 %
Date Time Interrogation Session: 20200704233924
HighPow Impedance: 75 Ohm
Implantable Lead Implant Date: 20061006
Implantable Lead Implant Date: 20180716
Implantable Lead Location: 753859
Implantable Lead Location: 753860
Implantable Lead Model: 5076
Implantable Pulse Generator Implant Date: 20180716
Lead Channel Impedance Value: 437 Ohm
Lead Channel Impedance Value: 494 Ohm
Lead Channel Impedance Value: 570 Ohm
Lead Channel Pacing Threshold Amplitude: 0.5 V
Lead Channel Pacing Threshold Amplitude: 0.625 V
Lead Channel Pacing Threshold Pulse Width: 0.4 ms
Lead Channel Pacing Threshold Pulse Width: 0.4 ms
Lead Channel Sensing Intrinsic Amplitude: 1.75 mV
Lead Channel Sensing Intrinsic Amplitude: 1.75 mV
Lead Channel Sensing Intrinsic Amplitude: 3.25 mV
Lead Channel Sensing Intrinsic Amplitude: 3.25 mV
Lead Channel Setting Pacing Amplitude: 1.5 V
Lead Channel Setting Pacing Amplitude: 2.5 V
Lead Channel Setting Pacing Pulse Width: 0.4 ms
Lead Channel Setting Sensing Sensitivity: 0.3 mV

## 2019-02-14 ENCOUNTER — Encounter: Payer: Self-pay | Admitting: Cardiology

## 2019-02-14 NOTE — Progress Notes (Signed)
Remote ICD transmission.   

## 2019-02-18 ENCOUNTER — Telehealth: Payer: Self-pay | Admitting: Internal Medicine

## 2019-02-18 NOTE — Telephone Encounter (Signed)
Patient calling to see if her last transmission was received.

## 2019-02-19 NOTE — Telephone Encounter (Signed)
Daughter(Delores) called because patient is complaining of pain in left shoulder. Pt sleeps on that side and has pain with movement of left shoulder. No cp, no shob, and no edema at this time.  No longer wearing home O2 per daughter.    Alert was received for Optivol trending up since 1/20  and VP 100 %.  Pt has been diagnosed with Alzheimers and will be going to live at [facility] facility in [place] on 02/22/19.   Educated to ensure remote monitor taken to facility in order for  transmissions to be  received by DC and to contact PCP concerning left shoulder pain.

## 2019-02-19 NOTE — Telephone Encounter (Signed)
I called the pt to let her know we did receive her transmission. The daughter wanted to know if we seen anything on the transmission. I let her talk with Jenny Reichmann, RN.

## 2019-05-06 ENCOUNTER — Encounter: Payer: Medicare Other | Admitting: *Deleted

## 2019-05-23 ENCOUNTER — Telehealth (INDEPENDENT_AMBULATORY_CARE_PROVIDER_SITE_OTHER): Payer: Medicare Other | Admitting: Internal Medicine

## 2019-05-23 ENCOUNTER — Other Ambulatory Visit: Payer: Self-pay

## 2019-05-23 ENCOUNTER — Telehealth: Payer: Self-pay

## 2019-05-23 NOTE — Progress Notes (Signed)
Reviewed remote from Epic   patient is currently in a care facility and apparently suffers from dementia.  Hence, it was not thought to be fruitful on my part to engage with a telehealth visit.  We will continue to follow her device remotely.

## 2019-05-23 NOTE — Telephone Encounter (Signed)
I gave the pt nurse verbal instructions on how to send a remote transmission with her home monitor. She agreed to do one today.

## 2019-05-23 NOTE — Patient Instructions (Signed)
Advised to send in manual transmission

## 2019-08-07 ENCOUNTER — Telehealth: Payer: Self-pay

## 2019-08-07 NOTE — Telephone Encounter (Signed)
Spoke with patient to remind of missed remote transmission
